# Patient Record
Sex: Female | Born: 1980
Health system: Southern US, Community
[De-identification: ages and names within clinical notes are randomized; demographics above are authoritative.]

## PROBLEM LIST (undated history)

## (undated) DIAGNOSIS — T7840XA Allergy, unspecified, initial encounter: Secondary | ICD-10-CM

## (undated) DIAGNOSIS — J45909 Unspecified asthma, uncomplicated: Secondary | ICD-10-CM

## (undated) DIAGNOSIS — D509 Iron deficiency anemia, unspecified: Secondary | ICD-10-CM

## (undated) HISTORY — DX: Unspecified asthma, uncomplicated: J45.909

## (undated) HISTORY — DX: Iron deficiency anemia, unspecified: D50.9

## (undated) HISTORY — PX: OTHER SURGICAL HISTORY: SHX169

## (undated) HISTORY — DX: Allergy, unspecified, initial encounter: T78.40XA

---

## 2003-06-04 ENCOUNTER — Emergency Department (HOSPITAL_COMMUNITY): Admission: EM | Admit: 2003-06-04 | Discharge: 2003-06-04 | Payer: Self-pay | Admitting: Emergency Medicine

## 2003-08-13 ENCOUNTER — Emergency Department (HOSPITAL_COMMUNITY): Admission: EM | Admit: 2003-08-13 | Discharge: 2003-08-14 | Payer: Self-pay | Admitting: Emergency Medicine

## 2003-08-23 ENCOUNTER — Emergency Department (HOSPITAL_COMMUNITY): Admission: EM | Admit: 2003-08-23 | Discharge: 2003-08-23 | Payer: Self-pay | Admitting: Family Medicine
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
## 2006-01-11 ENCOUNTER — Emergency Department (HOSPITAL_COMMUNITY): Admission: EM | Admit: 2006-01-11 | Discharge: 2006-01-11 | Payer: Self-pay | Admitting: Family Medicine

## 2007-04-27 ENCOUNTER — Emergency Department (HOSPITAL_COMMUNITY): Admission: AD | Admit: 2007-04-27 | Discharge: 2007-04-27 | Payer: Self-pay | Admitting: Emergency Medicine

## 2008-01-22 ENCOUNTER — Emergency Department (HOSPITAL_COMMUNITY): Admission: EM | Admit: 2008-01-22 | Discharge: 2008-01-22 | Payer: Self-pay | Admitting: Family Medicine

## 2010-11-09 LAB — POCT URINALYSIS DIP (DEVICE)
Bilirubin Urine: NEGATIVE
Nitrite: NEGATIVE
Operator id: 200941
Protein, ur: NEGATIVE
Specific Gravity, Urine: 1.01
Urobilinogen, UA: 0.2
pH: 6.5

## 2010-11-09 LAB — POCT PREGNANCY, URINE: Preg Test, Ur: NEGATIVE

## 2010-11-09 LAB — WET PREP, GENITAL

## 2010-11-20 LAB — POCT URINALYSIS DIP (DEVICE)
Glucose, UA: 100 mg/dL — AB
Nitrite: POSITIVE — AB
Protein, ur: 30 mg/dL — AB
Specific Gravity, Urine: <=1.005 (ref 1.005–1.030)
Urobilinogen, UA: 1 mg/dL (ref 0.0–1.0)
pH: 5 (ref 5.0–8.0)

## 2010-11-20 LAB — POCT PREGNANCY, URINE: Preg Test, Ur: NEGATIVE

## 2015-09-25 DIAGNOSIS — Z6826 Body mass index (BMI) 26.0-26.9, adult: Secondary | ICD-10-CM | POA: Diagnosis not present

## 2015-09-25 DIAGNOSIS — Z01419 Encounter for gynecological examination (general) (routine) without abnormal findings: Secondary | ICD-10-CM | POA: Diagnosis not present

## 2015-09-25 DIAGNOSIS — Z1151 Encounter for screening for human papillomavirus (HPV): Secondary | ICD-10-CM | POA: Diagnosis not present

## 2016-02-27 MED FILL — METHYLPREDNISOLONE 4 MG TAB: 4 | 6 days supply | Qty: 21 | Fill #0

## 2016-10-07 DIAGNOSIS — Z6825 Body mass index (BMI) 25.0-25.9, adult: Secondary | ICD-10-CM | POA: Diagnosis not present

## 2016-10-07 DIAGNOSIS — Z1151 Encounter for screening for human papillomavirus (HPV): Secondary | ICD-10-CM | POA: Diagnosis not present

## 2016-10-07 DIAGNOSIS — Z01419 Encounter for gynecological examination (general) (routine) without abnormal findings: Secondary | ICD-10-CM | POA: Diagnosis not present

## 2017-05-24 MED FILL — metroNIDAZOLE 500 MG TABS: 500 | 7 days supply | Qty: 14 | Fill #0

## 2017-06-13 MED FILL — AZITHROMYCIN 500 MG TABLET: 500 | 3 days supply | Qty: 3 | Fill #0

## 2017-10-31 MED FILL — metroNIDAZOLE 500 MG TABS: 500 | 7 days supply | Qty: 14 | Fill #0

## 2018-06-21 MED FILL — FLUCONAZOLE 150 MG TABS: 150 | 4 days supply | Qty: 2 | Fill #0

## 2020-08-28 ENCOUNTER — Ambulatory Visit: Payer: Self-pay

## 2021-04-28 LAB — HM PAP SMEAR: HPV, high-risk: NEGATIVE

## 2021-12-07 ENCOUNTER — Other Ambulatory Visit (HOSPITAL_BASED_OUTPATIENT_CLINIC_OR_DEPARTMENT_OTHER): Payer: Self-pay

## 2021-12-07 MED ORDER — COMIRNATY 30 MCG/0.3ML IM SUSY
PREFILLED_SYRINGE | INTRAMUSCULAR | 0 refills | Status: DC
  Filled 2021-12-07: qty 0.3, 1d supply, fill #0

## 2021-12-08 ENCOUNTER — Other Ambulatory Visit (HOSPITAL_COMMUNITY): Payer: Self-pay

## 2021-12-10 ENCOUNTER — Other Ambulatory Visit (HOSPITAL_COMMUNITY): Payer: Self-pay

## 2022-01-05 ENCOUNTER — Other Ambulatory Visit (HOSPITAL_BASED_OUTPATIENT_CLINIC_OR_DEPARTMENT_OTHER): Payer: Self-pay

## 2022-01-05 MED ORDER — FERROUS SULFATE 325 (65 FE) MG PO TBEC
DELAYED_RELEASE_TABLET | ORAL | 0 refills | Status: DC
  Filled 2022-01-05: qty 90, 90d supply, fill #0

## 2022-01-13 ENCOUNTER — Telehealth: Payer: Self-pay | Admitting: Physician Assistant

## 2022-01-13 NOTE — Telephone Encounter (Signed)
Spoke with patient confirming new patient appointments 12/12

## 2022-01-21 ENCOUNTER — Other Ambulatory Visit (HOSPITAL_BASED_OUTPATIENT_CLINIC_OR_DEPARTMENT_OTHER): Payer: Self-pay

## 2022-01-25 NOTE — Progress Notes (Unsigned)
[facility] [phone]   [phone]  INITIAL CONSULT NOTE  Patient Care Team: Default, Provider, MD as PCP - General  Hematological/Oncological History -12/31/2021: Labs from PCP, Dr. Fleet Contras Ferguson-WBC 5.0, Hgb 8.1 (L), MCV 70.0 (L), Plt 345, Ferritin 3.1 (L), TIBC 555 (H), iron 23 (L), saturation 4% (L), transferriin 297 (H). Hemoglobinopathy profile confirmed sickle cell trait (heterozygous).   -01/26/2022: Establish care with [facility]  CHIEF COMPLAINTS/PURPOSE OF CONSULTATION:  "Iron deficiency anemia "  HISTORY OF PRESENTING ILLNESS:  Amanda Ferguson 41 y.o. female ;presents to the hematology clinic for evaluation of iron deficiency anemia. She is unaccompanied for this visit.   On exam today, Ms. Amanda Ferguson reports feeling fatigued but can complete her ADLs on her own. She used to follow a vegan/vegetarian diet several years ago and now eats meat. She denies nausea, vomiting or abdominal pain. Her menstrual cycles are more irregular occurring every 21 days.Her cycles are lasting longer up to 8 days with 2 days of heavy bleeding. She is established with gynecologist, Dr. Cherly Ferguson, who she plans to follow up to discuss interventions to manage her menstrual bleeding. She does crave ice regularly. She denies fevers, chills, sweats, shortness of breath, chest pain or cough. She has no other complaints. Rest of the 10 point ROS is below.   MEDICAL HISTORY:  History reviewed. No pertinent past medical history.  SURGICAL HISTORY: History reviewed. No pertinent surgical history.  SOCIAL HISTORY: Social History   Socioeconomic History   Marital status: Legally Separated    Spouse name: Not on file   Number of children: Not on file   Years of education: Not on file   Highest education level: Not on file  Occupational History   Not on file  Tobacco Use   Smoking status: Not on file   Smokeless tobacco: Not on file  Substance and Sexual  Activity   Alcohol use: Not on file   Drug use: Not on file   Sexual activity: Not on file  Other Topics Concern   Not on file  Social History Narrative   Not on file   Social Determinants of Health   Financial Resource Strain: Not on file  Food Insecurity: Not on file  Transportation Needs: Not on file  Physical Activity: Not on file  Stress: Not on file  Social Connections: Not on file  Intimate Partner Violence: Not on file    FAMILY HISTORY: Family History  Problem Relation Age of Onset   Breast cancer Mother    Breast cancer Maternal Aunt     ALLERGIES:  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  MEDICATIONS:  Current Outpatient Medications  Medication Sig Dispense Refill   COVID-19 mRNA vaccine 2023-2024 (COMIRNATY) syringe Inject into the muscle. 0.3 mL 0   ferrous sulfate 325 (65 FE) MG EC tablet 1 tablet Orally once a day 90 days (Patient not taking: Reported on 01/26/2022) 90 tablet 0   No current facility-administered medications for this visit.    REVIEW OF SYSTEMS:   Constitutional: ( - ) fevers, ( - )  chills , ( - ) night sweats Eyes: ( - ) blurriness of vision, ( - ) double vision, ( - ) watery eyes Ears, nose, mouth, throat, and face: ( - ) mucositis, ( - ) sore throat Respiratory: ( - ) cough, ( - ) dyspnea, ( - ) wheezes Cardiovascular: ( - ) palpitation, ( - ) chest discomfort, ( - ) lower extremity swelling  Gastrointestinal:  ( - ) nausea, ( - ) heartburn, ( - ) change in bowel habits Skin: ( - ) abnormal skin rashes Lymphatics: ( - ) new lymphadenopathy, ( - ) easy bruising Neurological: ( - ) numbness, ( - ) tingling, ( - ) new weaknesses Behavioral/Psych: ( - ) mood change, ( - ) new changes  All other systems were reviewed with the patient and are negative.  PHYSICAL EXAMINATION: ECOG PERFORMANCE STATUS: 1 - Symptomatic but completely ambulatory  Vitals:   01/26/22 0947  BP: (!) 99/59  Pulse: 73  Resp: 15  Temp: 97.7 F (36.5 C)   SpO2: 100%   Filed Weights   01/26/22 0947  Weight: 168 lb 4.8 oz (76.3 kg)    GENERAL: well appearing female in NAD  SKIN: skin color, texture, turgor are normal, no rashes or significant lesions EYES: conjunctiva are pink and non-injected, sclera clear OROPHARYNX: no exudate, no erythema; lips, buccal mucosa, and tongue normal  NECK: supple, non-tender LYMPH:  no palpable lymphadenopathy in the cervical or supraclavicular lymph nodes.  LUNGS: clear to auscultation and percussion with normal breathing effort HEART: regular rate & rhythm and no murmurs and no lower extremity edema Musculoskeletal: no cyanosis of digits and no clubbing  PSYCH: alert & oriented x 3, fluent speech NEURO: no focal motor/sensory deficits  LABORATORY DATA:  I have reviewed the data as listed    Latest Ref Rng & Units 01/26/2022   10:15 AM  CBC  WBC 4.0 - 10.5 K/uL 5.7   Hemoglobin 12.0 - 15.0 g/dL 7.8   Hematocrit 36.0 - 46.0 % 25.2   Platelets 150 - 400 K/uL 362        Latest Ref Rng & Units 01/26/2022   10:15 AM  CMP  Glucose 70 - 99 mg/dL 78   BUN 6 - 20 mg/dL 10   Creatinine 0.44 - 1.00 mg/dL 0.87   Sodium 135 - 145 mmol/L 137   Potassium 3.5 - 5.1 mmol/L 4.2   Chloride 98 - 111 mmol/L 107   CO2 22 - 32 mmol/L 26   Calcium 8.9 - 10.3 mg/dL 9.4   Total Protein 6.5 - 8.1 g/dL 6.8   Total Bilirubin 0.3 - 1.2 mg/dL 0.3   Alkaline Phos 38 - 126 U/L 54   AST 15 - 41 U/L 21   ALT 0 - 44 U/L 10      ASSESSMENT & PLAN Amanda Ferguson is a 41 y.o. female who presents to the hematology clinic for evaluation of iron deficiency anemia.   # Iron Deficiency Anemia 2/2 to GYN Bleeding -- Findings are consistent with iron deficiency anemia secondary to patient's menorrhagia --Encouraged her to follow-up with OB/GYN for better control of her menstrual cycles --We will confirm iron deficiency anemia by ordering iron panel and ferritin as well as reticulocytes, CBC, and CMP --Unable to  tolerate PO iron.  --We will plan to proceed with IV iron therapy in order to help bolster the patient's blood counts --Plan for return to clinic in 4 to 6 weeks time after last dose of IV iron  #Sickle Cell Trait: --Will contribute to patient's microcytosis.  --Monitor for now.   Orders Placed This Encounter  Procedures   CBC with Differential ([place] Only)    Standing Status:   Future    Number of Occurrences:   1    Standing Expiration Date:   01/26/2023   CMP ([place] only)    Standing  Status:   Future    Number of Occurrences:   1    Standing Expiration Date:   01/26/2023   Ferritin    Standing Status:   Future    Number of Occurrences:   1    Standing Expiration Date:   01/26/2023   Iron and Iron Binding Capacity ([facility] only)    Standing Status:   Future    Number of Occurrences:   1    Standing Expiration Date:   01/26/2023   Retic Panel    Standing Status:   Future    Number of Occurrences:   1    Standing Expiration Date:   01/26/2023   Sample to Blood Bank    Standing Status:   Future    Number of Occurrences:   1    Standing Expiration Date:   01/27/2023    All questions were answered. The patient knows to call the clinic with any problems, questions or concerns.  I have spent a total of 60 minutes minutes of face-to-face and non-face-to-face time, preparing to see the patient, obtaining and/or reviewing separately obtained history, performing a medically appropriate examination, counseling and educating the patient, ordering medications/tests/procedures, documenting clinical information in the electronic health record,and care coordination.   Dede Query, PA-C Department of Hematology/Oncology [place] at [facility] Phone: [phone]

## 2022-01-26 ENCOUNTER — Inpatient Hospital Stay: Payer: Managed Care, Other (non HMO)

## 2022-01-26 ENCOUNTER — Encounter: Payer: Self-pay | Admitting: Physician Assistant

## 2022-01-26 ENCOUNTER — Inpatient Hospital Stay: Payer: Managed Care, Other (non HMO) | Attending: Physician Assistant | Admitting: Physician Assistant

## 2022-01-26 VITALS — BP 99/59 | HR 73 | Temp 97.7°F | Resp 15 | Wt 168.3 lb

## 2022-01-26 DIAGNOSIS — D573 Sickle-cell trait: Secondary | ICD-10-CM | POA: Diagnosis not present

## 2022-01-26 DIAGNOSIS — N921 Excessive and frequent menstruation with irregular cycle: Secondary | ICD-10-CM | POA: Diagnosis not present

## 2022-01-26 DIAGNOSIS — D500 Iron deficiency anemia secondary to blood loss (chronic): Secondary | ICD-10-CM | POA: Insufficient documentation

## 2022-01-26 LAB — CMP (CANCER CENTER ONLY)
ALT: 10 U/L (ref 0–44)
AST: 21 U/L (ref 15–41)
Albumin: 4.3 g/dL (ref 3.5–5.0)
Alkaline Phosphatase: 54 U/L (ref 38–126)
Anion gap: 4 — ABNORMAL LOW (ref 5–15)
BUN: 10 mg/dL (ref 6–20)
CO2: 26 mmol/L (ref 22–32)
Calcium: 9.4 mg/dL (ref 8.9–10.3)
Chloride: 107 mmol/L (ref 98–111)
Creatinine: 0.87 mg/dL (ref 0.44–1.00)
GFR, Estimated: >60 mL/min (ref >60)
Glucose, Bld: 78 mg/dL (ref 70–99)
Potassium: 4.2 mmol/L (ref 3.5–5.1)
Sodium: 137 mmol/L (ref 135–145)
Total Bilirubin: 0.3 mg/dL (ref 0.3–1.2)
Total Protein: 6.8 g/dL (ref 6.5–8.1)

## 2022-01-26 LAB — CBC WITH DIFFERENTIAL (CANCER CENTER ONLY)
Abs Immature Granulocytes: 0.01 10*3/uL (ref 0.00–0.07)
Basophils Absolute: 0 10*3/uL (ref 0.0–0.1)
Basophils Relative: 1 %
Eosinophils Absolute: 0.2 10*3/uL (ref 0.0–0.5)
Eosinophils Relative: 4 %
HCT: 25.2 % — ABNORMAL LOW (ref 36.0–46.0)
Hemoglobin: 7.8 g/dL — ABNORMAL LOW (ref 12.0–15.0)
Immature Granulocytes: 0 %
Lymphocytes Relative: 29 %
Lymphs Abs: 1.7 10*3/uL (ref 0.7–4.0)
MCH: 22.1 pg — ABNORMAL LOW (ref 26.0–34.0)
MCHC: 31 g/dL (ref 30.0–36.0)
MCV: 71.4 fL — ABNORMAL LOW (ref 80.0–100.0)
Monocytes Absolute: 0.3 10*3/uL (ref 0.1–1.0)
Monocytes Relative: 5 %
Neutro Abs: 3.5 10*3/uL (ref 1.7–7.7)
Neutrophils Relative %: 61 %
Platelet Count: 362 10*3/uL (ref 150–400)
RBC: 3.53 MIL/uL — ABNORMAL LOW (ref 3.87–5.11)
RDW: 20.2 % — ABNORMAL HIGH (ref 11.5–15.5)
WBC Count: 5.7 10*3/uL (ref 4.0–10.5)
nRBC: 0 % (ref 0.0–0.2)

## 2022-01-26 LAB — RETIC PANEL
Immature Retic Fract: 17.1 % — ABNORMAL HIGH (ref 2.3–15.9)
RBC.: 3.56 MIL/uL — ABNORMAL LOW (ref 3.87–5.11)
Retic Count, Absolute: 34.5 10*3/uL (ref 19.0–186.0)
Retic Ct Pct: 1 % (ref 0.4–3.1)
Reticulocyte Hemoglobin: 22.8 pg — ABNORMAL LOW (ref >27.9)

## 2022-01-26 LAB — FERRITIN: Ferritin: 4 ng/mL — ABNORMAL LOW (ref 11–307)

## 2022-01-26 LAB — IRON AND IRON BINDING CAPACITY (CC-WL,HP ONLY)
Iron: 5 ug/dL — ABNORMAL LOW (ref 28–170)
Saturation Ratios: 1 % — ABNORMAL LOW (ref 10.4–31.8)
TIBC: 473 ug/dL — ABNORMAL HIGH (ref 250–450)
UIBC: 468 ug/dL — ABNORMAL HIGH (ref 148–442)

## 2022-01-26 LAB — SAMPLE TO BLOOD BANK

## 2022-01-27 ENCOUNTER — Encounter: Payer: Self-pay | Admitting: Physician Assistant

## 2022-01-28 ENCOUNTER — Encounter: Payer: Self-pay | Admitting: Physician Assistant

## 2022-01-28 ENCOUNTER — Inpatient Hospital Stay: Payer: Managed Care, Other (non HMO)

## 2022-01-28 VITALS — BP 104/60 | HR 56 | Temp 98.4°F | Resp 18

## 2022-01-28 DIAGNOSIS — D500 Iron deficiency anemia secondary to blood loss (chronic): Secondary | ICD-10-CM | POA: Diagnosis not present

## 2022-01-28 MED ORDER — SODIUM CHLORIDE 0.9 % IV SOLN: Freq: Once | INTRAVENOUS | Status: AC

## 2022-01-28 MED ORDER — SODIUM CHLORIDE 0.9 % IV SOLN
200.0000 mg | Freq: Once | INTRAVENOUS | Status: AC
  Administered 2022-01-28: 200 mg via INTRAVENOUS
  Filled 2022-01-28: qty 200

## 2022-01-28 NOTE — Patient Instructions (Signed)

## 2022-02-04 ENCOUNTER — Other Ambulatory Visit: Payer: Self-pay

## 2022-02-04 ENCOUNTER — Inpatient Hospital Stay: Payer: Managed Care, Other (non HMO)

## 2022-02-04 VITALS — BP 103/43 | HR 65 | Temp 98.6°F | Resp 17

## 2022-02-04 DIAGNOSIS — D500 Iron deficiency anemia secondary to blood loss (chronic): Secondary | ICD-10-CM

## 2022-02-04 MED ORDER — SODIUM CHLORIDE 0.9 % IV SOLN
200.0000 mg | Freq: Once | INTRAVENOUS | Status: AC
  Administered 2022-02-04: 200 mg via INTRAVENOUS
  Filled 2022-02-04: qty 200

## 2022-02-04 MED ORDER — SODIUM CHLORIDE 0.9 % IV SOLN: Freq: Once | INTRAVENOUS | Status: AC

## 2022-02-11 ENCOUNTER — Inpatient Hospital Stay: Payer: Managed Care, Other (non HMO)

## 2022-02-11 VITALS — BP 102/48 | HR 50 | Temp 98.1°F | Resp 17

## 2022-02-11 DIAGNOSIS — D500 Iron deficiency anemia secondary to blood loss (chronic): Secondary | ICD-10-CM

## 2022-02-11 MED ORDER — SODIUM CHLORIDE 0.9 % IV SOLN
200.0000 mg | Freq: Once | INTRAVENOUS | Status: AC
  Administered 2022-02-11: 200 mg via INTRAVENOUS
  Filled 2022-02-11: qty 200

## 2022-02-11 MED ORDER — SODIUM CHLORIDE 0.9 % IV SOLN: Freq: Once | INTRAVENOUS | Status: AC

## 2022-02-18 ENCOUNTER — Inpatient Hospital Stay: Payer: Self-pay | Attending: Physician Assistant

## 2022-02-18 VITALS — BP 99/58 | HR 55 | Temp 98.1°F | Resp 18

## 2022-02-18 DIAGNOSIS — N920 Excessive and frequent menstruation with regular cycle: Secondary | ICD-10-CM | POA: Insufficient documentation

## 2022-02-18 DIAGNOSIS — D500 Iron deficiency anemia secondary to blood loss (chronic): Secondary | ICD-10-CM | POA: Insufficient documentation

## 2022-02-18 MED ORDER — SODIUM CHLORIDE 0.9 % IV SOLN
200.0000 mg | Freq: Once | INTRAVENOUS | Status: AC
  Administered 2022-02-18: 200 mg via INTRAVENOUS
  Filled 2022-02-18: qty 200

## 2022-02-18 MED ORDER — SODIUM CHLORIDE 0.9 % IV SOLN: Freq: Once | INTRAVENOUS | Status: AC

## 2022-02-18 NOTE — Patient Instructions (Signed)

## 2022-02-21 ENCOUNTER — Encounter: Payer: Self-pay | Admitting: Physician Assistant

## 2022-02-25 ENCOUNTER — Inpatient Hospital Stay: Payer: Self-pay

## 2022-02-25 VITALS — BP 104/57 | HR 64 | Temp 97.9°F | Resp 18

## 2022-02-25 DIAGNOSIS — D500 Iron deficiency anemia secondary to blood loss (chronic): Secondary | ICD-10-CM

## 2022-02-25 MED ORDER — SODIUM CHLORIDE 0.9 % IV SOLN
200.0000 mg | Freq: Once | INTRAVENOUS | Status: AC
  Administered 2022-02-25: 200 mg via INTRAVENOUS
  Filled 2022-02-25: qty 200

## 2022-02-25 MED ORDER — SODIUM CHLORIDE 0.9 % IV SOLN: Freq: Once | INTRAVENOUS | Status: AC

## 2022-02-25 NOTE — Patient Instructions (Signed)

## 2022-03-02 ENCOUNTER — Other Ambulatory Visit (HOSPITAL_BASED_OUTPATIENT_CLINIC_OR_DEPARTMENT_OTHER): Payer: Self-pay

## 2022-03-02 MED ORDER — METRONIDAZOLE 500 MG PO TABS
500.0000 mg | ORAL_TABLET | Freq: Two times a day (BID) | ORAL | 0 refills | Status: DC
  Filled 2022-03-02 – 2022-03-18 (×2): qty 14, 7d supply, fill #0

## 2022-03-15 ENCOUNTER — Other Ambulatory Visit (HOSPITAL_BASED_OUTPATIENT_CLINIC_OR_DEPARTMENT_OTHER): Payer: Self-pay

## 2022-03-18 ENCOUNTER — Other Ambulatory Visit (HOSPITAL_BASED_OUTPATIENT_CLINIC_OR_DEPARTMENT_OTHER): Payer: Self-pay

## 2022-03-19 ENCOUNTER — Inpatient Hospital Stay: Payer: Self-pay | Admitting: Physician Assistant

## 2022-03-19 ENCOUNTER — Telehealth: Payer: Self-pay | Admitting: Physician Assistant

## 2022-03-19 ENCOUNTER — Inpatient Hospital Stay: Payer: Self-pay

## 2022-03-19 ENCOUNTER — Other Ambulatory Visit: Payer: Self-pay

## 2022-03-19 ENCOUNTER — Other Ambulatory Visit (HOSPITAL_BASED_OUTPATIENT_CLINIC_OR_DEPARTMENT_OTHER): Payer: Self-pay

## 2022-03-19 DIAGNOSIS — U071 COVID-19: Secondary | ICD-10-CM

## 2022-03-19 MED ORDER — NIRMATRELVIR/RITONAVIR (PAXLOVID)TABLET
3.0000 | ORAL_TABLET | Freq: Two times a day (BID) | ORAL | 0 refills | Status: AC
  Filled 2022-03-19: qty 30, 5d supply, fill #0

## 2022-03-19 NOTE — Patient Instructions (Signed)
Amanda Ferguson, thank you for joining Mar Daring, PA-C for today's virtual visit.  While this provider is not your primary care provider (PCP), if your PCP is located in our provider database this encounter information will be shared with them immediately following your visit.   [facility] account gives you access to today's visit and all your visits, tests, and labs performed at [facility] " click here if you don't have a [facility] account or go to [email or web address]  Consent: (Patient) Amanda Ferguson provided verbal consent for this virtual visit at the beginning of the encounter.  Current Medications:  Current Outpatient Medications:    nirmatrelvir/ritonavir (PAXLOVID) 20 x 150 MG & 10 x 100MG  TABS, Take 3 tablets by mouth 2 (two) times daily for 5 days., Disp: 30 tablet, Rfl: 0   COVID-19 mRNA vaccine 2023-2024 (COMIRNATY) syringe, Inject into the muscle., Disp: 0.3 mL, Rfl: 0   metroNIDAZOLE (FLAGYL) 500 MG tablet, Take 1 tablet (500 mg total) by mouth 2 (two) times daily., Disp: 14 tablet, Rfl: 0   Medications ordered in this encounter:  Meds ordered this encounter  Medications   nirmatrelvir/ritonavir (PAXLOVID) 20 x 150 MG & 10 x 100MG  TABS    Sig: Take 3 tablets by mouth 2 (two) times daily for 5 days.    Dispense:  30 tablet    Refill:  0    Order Specific Question:   Supervising Provider    Answer:   Chase Picket [id number]     *If you need refills on other medications prior to your next appointment, please contact your pharmacy*  Follow-Up: Call back or seek an in-person evaluation if the symptoms worsen or if the condition fails to improve as anticipated.  [place] [phone]  Care Instructions:  Paxlovid- Nirmatrelvir; Ritonavir Tablets What is this medication? NIRMATRELVIR; RITONAVIR (NIR ma TREL vir; ri TOE na veer) treats mild to moderate COVID-19. It may help people who are at  high risk of developing severe illness. It works by limiting the spread of the virus in your body. This medicine may be used for other purposes; ask your health care provider or pharmacist if you have questions. COMMON BRAND NAME(S): PAXLOVID What should I tell my care team before I take this medication? They need to know if you have any of these conditions: Any allergies Any serious illness Kidney disease Liver disease An unusual or allergic reaction to nirmatrelvir, ritonavir, other medications, foods, dyes, or preservatives Pregnant or trying to get pregnant Breast-feeding How should I use this medication? This product contains 2 different medications that are packaged together. For the standard dose, take 2 pink tablets of nirmatrelvir with 1 white tablet of ritonavir (3 tablets total) by mouth with water twice daily. Talk to your care team if you have kidney disease. You may need a different dose. Swallow the tablets whole. You can take it with or without food. If it upsets your stomach, take it with food. Take all of this medication unless your care team tells you to stop it early. Keep taking it even if you think you are better. Talk to your care team about the use of this medication in children. While it may be prescribed for children as young as 12 years for selected conditions, precautions do apply. Overdosage: If you think you have taken too much of this medicine contact a poison control center or emergency room at once. NOTE: This medicine is only  for you. Do not share this medicine with others. What if I miss a dose? If you miss a dose, take it as soon as you can unless it is more than 8 hours late. If it is more than 8 hours late, skip the missed dose. Take the next dose at the normal time. Do not take extra or 2 doses at the same time to make up for the missed dose. What may interact with this medication? Do not take this medication with any of the following  medications: Alfuzosin Certain medications for anxiety or sleep like midazolam, triazolam Certain medications for cancer like apalutamide, enzalutamide Certain medications for cholesterol like lovastatin, simvastatin Certain medications for irregular heart beat like amiodarone, dronedarone, flecainide, propafenone, quinidine Certain medications for pain like meperidine, piroxicam Certain medications for psychotic disorders like clozapine, lurasidone, pimozide Certain medications for seizures like carbamazepine, phenobarbital, phenytoin Colchicine Eletriptan Eplerenone Ergot alkaloids like dihydroergotamine, ergonovine, ergotamine, methylergonovine Finerenone Flibanserin Ivabradine Lomitapide Naloxegol Ranolazine Rifampin Sildenafil Silodosin St. John's Wort Tolvaptan Ubrogepant Voclosporin This medication may also interact with the following medications: Bedaquiline Birth control pills Bosentan Certain antibiotics like erythromycin or clarithromycin Certain medications for blood pressure like amlodipine, diltiazem, felodipine, nicardipine, nifedipine Certain medications for cancer like abemaciclib, ceritinib, dasatinib, encorafenib, ibrutinib, ivosidenib, neratinib, nilotinib, venetoclax, vinblastine, vincristine Certain medications for cholesterol like atorvastatin, rosuvastatin Certain medications for depression like bupropion, trazodone Certain medications for fungal infections like isavuconazonium, itraconazole, ketoconazole, voriconazole Certain medications for hepatitis C like elbasvir; grazoprevir, dasabuvir; ombitasvir; paritaprevir; ritonavir, glecaprevir; pibrentasvir, sofosbuvir; velpatasvir; voxilaprevir Certain medications for HIV or AIDS Certain medications for irregular heartbeat like lidocaine Certain medications that treat or prevent blood clots like rivaroxaban, warfarin Digoxin Fentanyl Medications that lower your chance of fighting infection like  cyclosporine, sirolimus, tacrolimus Methadone Quetiapine Rifabutin Salmeterol Steroid medications like betamethasone, budesonide, ciclesonide, dexamethasone, fluticasone, methylprednisolone, mometasone, triamcinolone This list may not describe all possible interactions. Give your health care provider a list of all the medicines, herbs, non-prescription drugs, or dietary supplements you use. Also tell them if you smoke, drink alcohol, or use illegal drugs. Some items may interact with your medicine. What should I watch for while using this medication? Your condition will be monitored carefully while you are receiving this medication. Visit your care team for regular checkups. Tell your care team if your symptoms do not start to get better or if they get worse. If you have untreated HIV infection, this medication may lead to some HIV medications not working as well in the future. Estrogen and progestin hormones may not work as well while you are taking this medication. Your care team can help you find the contraceptive option that works for you. What side effects may I notice from receiving this medication? Side effects that you should report to your care team as soon as possible: Allergic reactions--skin rash, itching, hives, swelling of the face, lips, tongue, or throat Liver injury--right upper belly pain, loss of appetite, nausea, light-colored stool, dark yellow or brown urine, yellowing skin or eyes, unusual weakness or fatigue Redness, blistering, peeling, or loosening of the skin, including inside the mouth Side effects that usually do not require medical attention (report these to your care team if they continue or are bothersome): Change in taste Diarrhea General discomfort and fatigue Increase in blood pressure Muscle pain Nausea Stomach pain This list may not describe all possible side effects. Call your doctor for medical advice about side effects. You may report side effects to  FDA at 1-800-FDA-1088. Where  should I keep my medication? Keep out of the reach of children and pets. Store at room temperature between 20 and 25 degrees C (68 and 77 degrees F). Get rid of any unused medication after the expiration date. To get rid of medications that are no longer needed or have expired: Take the medication to a medication take-back program. Check with your pharmacy or law enforcement to find a location. If you cannot return the medication, check the label or package insert to see if the medication should be thrown out in the garbage or flushed down the toilet. If you are not sure, ask your care team. If it is safe to put it in the trash, take the medication out of the container. Mix the medication with cat litter, dirt, coffee grounds, or other unwanted substance. Seal the mixture in a bag or container. Put it in the trash. NOTE: This sheet is a summary. It may not cover all possible information. If you have questions about this medicine, talk to your doctor, pharmacist, or health care provider.  2023 Elsevier/Gold Standard (2020-02-11 00:00:00)    Isolation Instructions: You are to isolate at home for 5 days from onset of your symptoms. If you must be around other household members who do not have symptoms, you need to make sure that both you and the family members are masking consistently with a high-quality mask.  After day 5 of isolation, if you have had no fever within 24 hours and you are feeling better, you can end isolation but need to mask for an additional 5 days.  After day 5 if you have a fever or are having significant symptoms, please isolate for full 10 days.  If you note any worsening of symptoms despite treatment, please seek an in-person evaluation ASAP. If you note any significant shortness of breath or any chest pain, please seek ER evaluation. Please do not delay care!   COVID-19: What to Do if You Are Sick If you test positive and are an older adult or  someone who is at high risk of getting very sick from COVID-19, treatment may be available. Contact a healthcare provider right away after a positive test to determine if you are eligible, even if your symptoms are mild right now. You can also visit a Test to Treat location and, if eligible, receive a prescription from a provider. Don't delay: Treatment must be started within the first few days to be effective. If you have a fever, cough, or other symptoms, you might have COVID-19. Most people have mild illness and are able to recover at home. If you are sick: Keep track of your symptoms. If you have an emergency warning sign (including trouble breathing), call 911. Steps to help prevent the spread of COVID-19 if you are sick If you are sick with COVID-19 or think you might have COVID-19, follow the steps below to care for yourself and to help protect other people in your home and community. Stay home except to get medical care Stay home. Most people with COVID-19 have mild illness and can recover at home without medical care. Do not leave your home, except to get medical care. Do not visit public areas and do not go to places where you are unable to wear a mask. Take care of yourself. Get rest and stay hydrated. Take over-the-counter medicines, such as acetaminophen, to help you feel better. Stay in touch with your doctor. Call before you get medical care. Be sure to get care  if you have trouble breathing, or have any other emergency warning signs, or if you think it is an emergency. Avoid public transportation, ride-sharing, or taxis if possible. Get tested If you have symptoms of COVID-19, get tested. While waiting for test results, stay away from others, including staying apart from those living in your household. Get tested as soon as possible after your symptoms start. Treatments may be available for people with COVID-19 who are at risk for becoming very sick. Don't delay: Treatment must be  started early to be effective--some treatments must begin within 5 days of your first symptoms. Contact your healthcare provider right away if your test result is positive to determine if you are eligible. Self-tests are one of several options for testing for the virus that causes COVID-19 and may be more convenient than laboratory-based tests and point-of-care tests. Ask your healthcare provider or your local health department if you need help interpreting your test results. You can visit your state, tribal, local, and territorial health department's website to look for the latest local information on testing sites. Separate yourself from other people As much as possible, stay in a specific room and away from other people and pets in your home. If possible, you should use a separate bathroom. If you need to be around other people or animals in or outside of the home, wear a well-fitting mask. Tell your close contacts that they may have been exposed to COVID-19. An infected person can spread COVID-19 starting 48 hours (or 2 days) before the person has any symptoms or tests positive. By letting your close contacts know they may have been exposed to COVID-19, you are helping to protect everyone. See COVID-19 and Animals if you have questions about pets. If you are diagnosed with COVID-19, someone from the health department may call you. Answer the call to slow the spread. Monitor your symptoms Symptoms of COVID-19 include fever, cough, or other symptoms. Follow care instructions from your healthcare provider and local health department. Your local health authorities may give instructions on checking your symptoms and reporting information. When to seek emergency medical attention Look for emergency warning signs* for COVID-19. If someone is showing any of these signs, seek emergency medical care immediately: Trouble breathing Persistent pain or pressure in the chest New confusion Inability to wake or  stay awake Pale, gray, or blue-colored skin, lips, or nail beds, depending on skin tone *This list is not all possible symptoms. Please call your medical provider for any other symptoms that are severe or concerning to you. Call 911 or call ahead to your local emergency facility: Notify the operator that you are seeking care for someone who has or may have COVID-19. Call ahead before visiting your doctor Call ahead. Many medical visits for routine care are being postponed or done by phone or telemedicine. If you have a medical appointment that cannot be postponed, call your doctor's office, and tell them you have or may have COVID-19. This will help the office protect themselves and other patients. If you are sick, wear a well-fitting mask You should wear a mask if you must be around other people or animals, including pets (even at home). Wear a mask with the best fit, protection, and comfort for you. You don't need to wear the mask if you are alone. If you can't put on a mask (because of trouble breathing, for example), cover your coughs and sneezes in some other way. Try to stay at least 6 feet away from  other people. This will help protect the people around you. Masks should not be placed on young children under age 49 years, anyone who has trouble breathing, or anyone who is not able to remove the mask without help. Cover your coughs and sneezes Cover your mouth and nose with a tissue when you cough or sneeze. Throw away used tissues in a lined trash can. Immediately wash your hands with soap and water for at least 20 seconds. If soap and water are not available, clean your hands with an alcohol-based hand sanitizer that contains at least 60% alcohol. Clean your hands often Wash your hands often with soap and water for at least 20 seconds. This is especially important after blowing your nose, coughing, or sneezing; going to the bathroom; and before eating or preparing food. Use hand sanitizer if  soap and water are not available. Use an alcohol-based hand sanitizer with at least 60% alcohol, covering all surfaces of your hands and rubbing them together until they feel dry. Soap and water are the best option, especially if hands are visibly dirty. Avoid touching your eyes, nose, and mouth with unwashed hands. Handwashing Tips Avoid sharing personal household items Do not share dishes, drinking glasses, cups, eating utensils, towels, or bedding with other people in your home. Wash these items thoroughly after using them with soap and water or put in the dishwasher. Clean surfaces in your home regularly Clean and disinfect high-touch surfaces (for example, doorknobs, tables, handles, light switches, and countertops) in your "sick room" and bathroom. In shared spaces, you should clean and disinfect surfaces and items after each use by the person who is ill. If you are sick and cannot clean, a caregiver or other person should only clean and disinfect the area around you (such as your bedroom and bathroom) on an as needed basis. Your caregiver/other person should wait as long as possible (at least several hours) and wear a mask before entering, cleaning, and disinfecting shared spaces that you use. Clean and disinfect areas that may have blood, stool, or body fluids on them. Use household cleaners and disinfectants. Clean visible dirty surfaces with household cleaners containing soap or detergent. Then, use a household disinfectant. Use a product from [employer] List N: Disinfectants for Coronavirus (COVID-19). Be sure to follow the instructions on the label to ensure safe and effective use of the product. Many products recommend keeping the surface wet with a disinfectant for a certain period of time (look at "contact time" on the product label). You may also need to wear personal protective equipment, such as gloves, depending on the directions on the product label. Immediately after disinfecting, wash  your hands with soap and water for 20 seconds. For completed guidance on cleaning and disinfecting your home, visit Complete Disinfection Guidance. Take steps to improve ventilation at home Improve ventilation (air flow) at home to help prevent from spreading COVID-19 to other people in your household. Clear out COVID-19 virus particles in the air by opening windows, using air filters, and turning on fans in your home. Use this interactive tool to learn how to improve air flow in your home. When you can be around others after being sick with COVID-19 Deciding when you can be around others is different for different situations. Find out when you can safely end home isolation. For any additional questions about your care, contact your healthcare provider or state or local health department. 05/06/2020 Content source: [facility] for Immunization and Respiratory Diseases ([facility]), Division of Viral  Diseases This information is not intended to replace advice given to you by your health care provider. Make sure you discuss any questions you have with your health care provider. Document Revised: 06/19/2020 Document Reviewed: 06/19/2020 Elsevier Patient Education  2022 [employer].   If you have been instructed to have an in-person evaluation today at a local Urgent Care facility, please use the link below. It will take you to a list of all of our available [facility], including address, phone number and hours of operation. Please do not delay care.  [facility]  If you or a family member do not have a primary care provider, use the link below to schedule a visit and establish care. When you choose a [facility] primary care physician or advanced practice provider, you gain a long-term partner in health. Find a Primary Care Provider  Learn more about [facility]'s in-office and virtual care options: [facility]

## 2022-03-19 NOTE — Progress Notes (Signed)
Virtual Visit Consent   [name], you are scheduled for a virtual visit with a [facility] provider today. Just as with appointments in the office, your consent must be obtained to participate. Your consent will be active for this visit and any virtual visit you may have with one of our providers in the next 365 days. If you have a MyChart account, a copy of this consent can be sent to you electronically.  As this is a virtual visit, video technology does not allow for your provider to perform a traditional examination. This may limit your provider's ability to fully assess your condition. If your provider identifies any concerns that need to be evaluated in person or the need to arrange testing (such as labs, EKG, etc.), we will make arrangements to do so. Although advances in technology are sophisticated, we cannot ensure that it will always work on either your end or our end. If the connection with a video visit is poor, the visit may have to be switched to a telephone visit. With either a video or telephone visit, we are not always able to ensure that we have a secure connection.  By engaging in this virtual visit, you consent to the provision of healthcare and authorize for your insurance to be billed (if applicable) for the services provided during this visit. Depending on your insurance coverage, you may receive a charge related to this service.  I need to obtain your verbal consent now. Are you willing to proceed with your visit today? Amanda Ferguson has provided verbal consent on 03/19/2022 for a virtual visit (video or telephone). Mar Daring, PA-C  Date: 03/19/2022 4:03 PM  Virtual Visit via Video Note   I, Mar Daring, connected with  Amanda Ferguson  ([id number], 41-17-1982) on 03/19/22 at  4:00 PM EST by a video-enabled telemedicine application and verified that I am speaking with the correct person using two identifiers.  Location: Patient: Virtual Visit  Location Patient: Home Provider: Virtual Visit Location Provider: Home Office   I discussed the limitations of evaluation and management by telemedicine and the availability of in person appointments. The patient expressed understanding and agreed to proceed.    History of Present Illness: Amanda Ferguson is a 42 y.o. who identifies as a female who was assigned female at birth, and is being seen today for Covid 63.  HPI: URI  This is a new problem. Episode onset: tested positive today; Symptoms started yesterday. The problem has been gradually worsening. Maximum temperature: hot and cold flashes; sweats and chills. Associated symptoms include congestion, coughing, ear pain (smallest amount in right ear), headaches, a plugged ear sensation, rhinorrhea (and post nasal drainage) and sinus pain. Pertinent negatives include no diarrhea, nausea, sore throat or vomiting. Associated symptoms comments: Fatigue, myalgias, chills. Treatments tried: netti pot, theraflu. The treatment provided no relief.     Problems:  Patient Active Problem List   Diagnosis Date Noted   Iron deficiency anemia due to chronic blood loss 01/26/2022   Sickle cell trait ([place]) 01/26/2022    Allergies:  Allergies  Allergen Reactions   Penicillins Nausea And Vomiting and Other (See Comments)   Soy Allergy Diarrhea, Nausea Only and Swelling   Medications:  Current Outpatient Medications:    nirmatrelvir/ritonavir (PAXLOVID) 20 x 150 MG & 10 x 100MG  TABS, Take 3 tablets by mouth 2 (two) times daily for 5 days., Disp: 30 tablet, Rfl: 0   COVID-19 mRNA vaccine 2023-2024 (COMIRNATY) syringe,  Inject into the muscle., Disp: 0.3 mL, Rfl: 0   metroNIDAZOLE (FLAGYL) 500 MG tablet, Take 1 tablet (500 mg total) by mouth 2 (two) times daily., Disp: 14 tablet, Rfl: 0  Observations/Objective: Patient is well-developed, well-nourished in no acute distress.  Resting comfortably  at home.  Head is normocephalic, atraumatic.  No  labored breathing.  Speech is clear and coherent with logical content.  Patient is alert and oriented at baseline.    Assessment and Plan: 1. COVID-19 - nirmatrelvir/ritonavir (PAXLOVID) 20 x 150 MG & 10 x 100MG  TABS; Take 3 tablets by mouth 2 (two) times daily for 5 days.  Dispense: 30 tablet; Refill: 0  - Continue OTC symptomatic management of choice - Will send OTC vitamins and supplement information through AVS - Paxlovid prescribed - Patient enrolled in MyChart symptom monitoring - Push fluids - Rest as needed - Discussed return precautions and when to seek in-person evaluation, sent via AVS as well   Follow Up Instructions: I discussed the assessment and treatment plan with the patient. The patient was provided an opportunity to ask questions and all were answered. The patient agreed with the plan and demonstrated an understanding of the instructions.  A copy of instructions were sent to the patient via MyChart unless otherwise noted below.    The patient was advised to call back or seek an in-person evaluation if the symptoms worsen or if the condition fails to improve as anticipated.  Time:  I spent 12 minutes with the patient via telehealth technology discussing the above problems/concerns.    Mar Daring, PA-C

## 2022-05-13 ENCOUNTER — Encounter: Payer: Self-pay | Admitting: Physician Assistant

## 2022-05-17 ENCOUNTER — Encounter: Payer: Self-pay | Admitting: Physician Assistant

## 2022-05-17 ENCOUNTER — Telehealth: Payer: Self-pay | Admitting: Physician Assistant

## 2022-05-17 NOTE — Telephone Encounter (Signed)
Patient called to reschedule missed 2/2 appointments. Patient rescheduled and notified.

## 2022-05-19 ENCOUNTER — Telehealth: Payer: Self-pay | Admitting: Physician Assistant

## 2022-05-19 NOTE — Telephone Encounter (Signed)
Contacted patient to scheduled appointments. Left message with appointment details and a call back number if patient had any questions or could not accommodate the time we provided.   

## 2022-05-20 ENCOUNTER — Other Ambulatory Visit: Payer: Self-pay | Admitting: Physician Assistant

## 2022-05-20 DIAGNOSIS — D500 Iron deficiency anemia secondary to blood loss (chronic): Secondary | ICD-10-CM

## 2022-05-21 ENCOUNTER — Inpatient Hospital Stay (HOSPITAL_BASED_OUTPATIENT_CLINIC_OR_DEPARTMENT_OTHER): Payer: Self-pay | Admitting: Physician Assistant

## 2022-05-21 ENCOUNTER — Inpatient Hospital Stay: Payer: Medicaid Other | Attending: Physician Assistant

## 2022-05-21 ENCOUNTER — Inpatient Hospital Stay: Payer: Medicaid Other

## 2022-05-21 ENCOUNTER — Encounter: Payer: Self-pay | Admitting: Physician Assistant

## 2022-05-21 ENCOUNTER — Inpatient Hospital Stay: Payer: Self-pay | Admitting: Physician Assistant

## 2022-05-21 VITALS — BP 112/44 | HR 66 | Temp 97.7°F | Resp 20 | Wt 171.8 lb

## 2022-05-21 DIAGNOSIS — D500 Iron deficiency anemia secondary to blood loss (chronic): Secondary | ICD-10-CM

## 2022-05-21 DIAGNOSIS — N920 Excessive and frequent menstruation with regular cycle: Secondary | ICD-10-CM | POA: Insufficient documentation

## 2022-05-21 LAB — CBC WITH DIFFERENTIAL (CANCER CENTER ONLY)
Abs Immature Granulocytes: 0 10*3/uL (ref 0.00–0.07)
Basophils Absolute: 0 10*3/uL (ref 0.0–0.1)
Basophils Relative: 0 %
Eosinophils Absolute: 0.1 10*3/uL (ref 0.0–0.5)
Eosinophils Relative: 3 %
HCT: 35.2 % — ABNORMAL LOW (ref 36.0–46.0)
Hemoglobin: 12.5 g/dL (ref 12.0–15.0)
Immature Granulocytes: 0 %
Lymphocytes Relative: 26 %
Lymphs Abs: 1.3 10*3/uL (ref 0.7–4.0)
MCH: 32.1 pg (ref 26.0–34.0)
MCHC: 35.5 g/dL (ref 30.0–36.0)
MCV: 90.5 fL (ref 80.0–100.0)
Monocytes Absolute: 0.2 10*3/uL (ref 0.1–1.0)
Monocytes Relative: 4 %
Neutro Abs: 3.3 10*3/uL (ref 1.7–7.7)
Neutrophils Relative %: 67 %
Platelet Count: 286 10*3/uL (ref 150–400)
RBC: 3.89 MIL/uL (ref 3.87–5.11)
RDW: 15.1 % (ref 11.5–15.5)
WBC Count: 4.9 10*3/uL (ref 4.0–10.5)
nRBC: 0 % (ref 0.0–0.2)

## 2022-05-21 LAB — IRON AND IRON BINDING CAPACITY (CC-WL,HP ONLY)
Iron: 114 ug/dL (ref 28–170)
Saturation Ratios: 34 % — ABNORMAL HIGH (ref 10.4–31.8)
TIBC: 336 ug/dL (ref 250–450)
UIBC: 222 ug/dL (ref 148–442)

## 2022-05-21 LAB — FERRITIN: Ferritin: 13 ng/mL (ref 11–307)

## 2022-05-21 NOTE — Progress Notes (Signed)
[facility] [phone]   [phone]  INITIAL CONSULT NOTE  Patient Care Team: Default, Provider, MD as PCP - General  Hematological/Oncological History -12/31/2021: Labs from PCP, Dr. Fleet Contrasachel Hagler-WBC 5.0, Hgb 8.1 (L), MCV 70.0 (L), Plt 345, Ferritin 3.1 (L), TIBC 555 (H), iron 23 (L), saturation 4% (L), transferriin 297 (H). Hemoglobinopathy profile confirmed sickle cell trait (heterozygous).   -01/26/2022: [facility]  -01/28/2022-02/25/2022: IV venofer 200 mg once a week x 5 doses  CHIEF COMPLAINTS/PURPOSE OF CONSULTATION:  "Iron deficiency anemia "  HISTORY OF PRESENTING ILLNESS:  Paul Dykesshanti R Hardebeck 42 y.o. female returns for a follow up for iron deficiency anemia. She was last seen on 01/26/2022 to establish care. In the interim, she received IV iron infusion that she completed on 02/25/2022. She is unaccompanied for this visit.   On exam today, Ms. Marlyne BeardsJennings reports her energy levels have markedly improved with IV iron infusions. She is no longer craving ice. She continues to have heavy menstrual bleeding and plans to follow up with gynecologist on 06/04/2022. She denies nausea, vomiting or abdominal pain. Her bowel habits are unchanged without recurrent episodes of diarrhea or constipation. She denies fevers, chills, sweats, shortness of breath, chest pain or cough. She has no other complaints. Rest of the 10 point ROS is below.   MEDICAL HISTORY:  No past medical history on file.  SURGICAL HISTORY: No past surgical history on file.  SOCIAL HISTORY: Social History   Socioeconomic History   Marital status: Legally Separated    Spouse name: Not on file   Number of children: Not on file   Years of education: Not on file   Highest education level: Not on file  Occupational History   Not on file  Tobacco Use   Smoking status: Never   Smokeless tobacco: Never  Substance and Sexual Activity   Alcohol use: Not on  file   Drug use: Never   Sexual activity: Not on file  Other Topics Concern   Not on file  Social History Narrative   Not on file   Social Determinants of Health   Financial Resource Strain: Not on file  Food Insecurity: Not on file  Transportation Needs: Not on file  Physical Activity: Not on file  Stress: Not on file  Social Connections: Not on file  Intimate Partner Violence: Not on file    FAMILY HISTORY: Family History  Problem Relation Age of Onset   Breast cancer Mother    Breast cancer Maternal Aunt     ALLERGIES:  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  MEDICATIONS:  Current Outpatient Medications  Medication Sig Dispense Refill   Iron-Folic Acid-Vit B12 (IRON FORMULA PO) Take by mouth daily.     Magnesium 300 MG CAPS Take by mouth 2 (two) times daily. 150 mg  bid  with Malate, glycinate and citrate     COVID-19 mRNA vaccine 2023-2024 (COMIRNATY) syringe Inject into the muscle. (Patient not taking: Reported on 05/21/2022) 0.3 mL 0   metroNIDAZOLE (FLAGYL) 500 MG tablet Take 1 tablet (500 mg total) by mouth 2 (two) times daily. 14 tablet 0   No current facility-administered medications for this visit.    REVIEW OF SYSTEMS:   Constitutional: ( - ) fevers, ( - )  chills , ( - ) night sweats Eyes: ( - ) blurriness of vision, ( - ) double vision, ( - ) watery eyes Ears, nose, mouth, throat, and face: ( - )  mucositis, ( - ) sore throat Respiratory: ( - ) cough, ( - ) dyspnea, ( - ) wheezes Cardiovascular: ( - ) palpitation, ( - ) chest discomfort, ( - ) lower extremity swelling Gastrointestinal:  ( - ) nausea, ( - ) heartburn, ( - ) change in bowel habits Skin: ( - ) abnormal skin rashes Lymphatics: ( - ) new lymphadenopathy, ( - ) easy bruising Neurological: ( - ) numbness, ( - ) tingling, ( - ) new weaknesses Behavioral/Psych: ( - ) mood change, ( - ) new changes  All other systems were reviewed with the patient and are negative.  PHYSICAL  EXAMINATION: ECOG PERFORMANCE STATUS: 1 - Symptomatic but completely ambulatory  Vitals:   05/21/22 1248  BP: (!) 112/44  Pulse: 66  Resp: 20  Temp: 97.7 F (36.5 C)  SpO2: 100%   Filed Weights   05/21/22 1248  Weight: 171 lb 12.8 oz (77.9 kg)    GENERAL: well appearing female in NAD  SKIN: skin color, texture, turgor are normal, no rashes or significant lesions EYES: conjunctiva are pink and non-injected, sclera clear LUNGS: clear to auscultation and percussion with normal breathing effort HEART: regular rate & rhythm and no murmurs and no lower extremity edema Musculoskeletal: no cyanosis of digits and no clubbing  PSYCH: alert & oriented x 3, fluent speech NEURO: no focal motor/sensory deficits  LABORATORY DATA:  I have reviewed the data as listed    Latest Ref Rng & Units 05/21/2022   12:42 PM 01/26/2022   10:15 AM  CBC  WBC 4.0 - 10.5 K/uL 4.9  5.7   Hemoglobin 12.0 - 15.0 g/dL 92.412.5  7.8   Hematocrit 36.0 - 46.0 % 35.2  25.2   Platelets 150 - 400 K/uL 286  362        Latest Ref Rng & Units 01/26/2022   10:15 AM  CMP  Glucose 70 - 99 mg/dL 78   BUN 6 - 20 mg/dL 10   Creatinine 2.680.44 - 1.00 mg/dL 3.410.87   Sodium 962135 - 229145 mmol/L 137   Potassium 3.5 - 5.1 mmol/L 4.2   Chloride 98 - 111 mmol/L 107   CO2 22 - 32 mmol/L 26   Calcium 8.9 - 10.3 mg/dL 9.4   Total Protein 6.5 - 8.1 g/dL 6.8   Total Bilirubin 0.3 - 1.2 mg/dL 0.3   Alkaline Phos 38 - 126 U/L 54   AST 15 - 41 U/L 21   ALT 0 - 44 U/L 10      ASSESSMENT & PLAN Paul Dykesshanti R Ferg is a 42 y.o. female who presents to the hematology clinic for evaluation of iron deficiency anemia.   # Iron Deficiency Anemia 2/2 to GYN Bleeding --Findings are consistent with iron deficiency anemia secondary to patient's menorrhagia --Scheduled to follow up with OB/GYN on 06/04/2022 to discuss interventions for better control of her menstrual cycles --Unable to tolerate PO iron.  --Received IV venofer 200 mg once a week x  5 doses from 01/28/22-02/25/22.  --Labs today show anemia has resolved with Hgb 12.5, MCV 90.5. Iron panel shows serum iron 114, TIBC 336, saturation 34%. Ferritin levels pending. --No need for additional IV iron at this time --RTC in 3 months for labs only and 6 months for labs/follow up.   #Sickle Cell Trait: --Will contribute to patient's microcytosis.  --Monitor for now.   No orders of the defined types were placed in this encounter.   All questions were answered. The patient  knows to call the clinic with any problems, questions or concerns.  I have spent a total of 25 minutes minutes of face-to-face and non-face-to-face time, preparing to see the patient, performing a medically appropriate examination, counseling and educating the patient, ordering medications/tests/procedures, documenting clinical information in the electronic health record,and care coordination.   Georga Kaufmann, PA-C Department of Hematology/Oncology [facility] Phone: [phone]

## 2022-05-24 ENCOUNTER — Telehealth: Payer: Self-pay | Admitting: Physician Assistant

## 2022-05-24 ENCOUNTER — Telehealth: Payer: Self-pay

## 2022-05-24 ENCOUNTER — Encounter: Payer: Self-pay | Admitting: Physician Assistant

## 2022-05-24 NOTE — Addendum Note (Signed)
Addended by: Georga Kaufmann T on: 05/24/2022 12:00 PM   Modules accepted: Orders

## 2022-05-24 NOTE — Telephone Encounter (Signed)
reached out to patient per 4/5 LOS,left voicemail.

## 2022-05-24 NOTE — Telephone Encounter (Signed)
Please notify patient that her ferritin is still low so recommend IV venofer x 2 more doses to improve it    Pt advised via My Chart message

## 2022-05-24 NOTE — Telephone Encounter (Signed)
Reached out to patient to schedule per Karena Addison; sent to [facility].

## 2022-05-27 ENCOUNTER — Ambulatory Visit: Payer: Medicaid Other

## 2022-06-03 ENCOUNTER — Ambulatory Visit: Payer: Medicaid Other

## 2022-06-04 ENCOUNTER — Ambulatory Visit (INDEPENDENT_AMBULATORY_CARE_PROVIDER_SITE_OTHER): Payer: Self-pay | Admitting: Radiology

## 2022-06-04 ENCOUNTER — Other Ambulatory Visit (HOSPITAL_BASED_OUTPATIENT_CLINIC_OR_DEPARTMENT_OTHER): Payer: Self-pay

## 2022-06-04 ENCOUNTER — Encounter: Payer: Self-pay | Admitting: Radiology

## 2022-06-04 VITALS — BP 122/76 | Ht 64.0 in | Wt 168.0 lb

## 2022-06-04 DIAGNOSIS — N939 Abnormal uterine and vaginal bleeding, unspecified: Secondary | ICD-10-CM

## 2022-06-04 DIAGNOSIS — Z30011 Encounter for initial prescription of contraceptive pills: Secondary | ICD-10-CM

## 2022-06-04 MED ORDER — NORETHIN-ETH ESTRAD-FE BIPHAS 1 MG-10 MCG / 10 MCG PO TABS
1.0000 | ORAL_TABLET | Freq: Every day | ORAL | 0 refills | Status: DC
Start: 2022-06-04 — End: 2022-08-24
  Filled 2022-06-04: qty 28, 28d supply, fill #0

## 2022-06-04 NOTE — Progress Notes (Signed)
   Amanda Ferguson 30-Jun-1980 [id number]   History:  42 y.o. G0 presents as a referral from hematology for chronic blood loss. She reports heavy irregular cycles with intermenstrual bleeding that can also be heavy at times. Symptoms for the past 18 months. Has been receiving IV iron infusions.   Gynecologic History Patient's last menstrual period was 06/01/2022 (approximate). Period Pattern: (!) Irregular Menstrual Control: Maxi pad (menstrual cup, period panties) Dysmenorrhea: None Contraception/Family planning: abstinence Sexually active: no   Obstetric History OB History  Gravida Para Term Preterm AB Living  0 0 0 0 0 0  SAB IAB Ectopic Multiple Live Births  0 0 0 0 0     The following portions of the patient's history were reviewed and updated as appropriate: allergies, current medications, past family history, past medical history, past social history, past surgical history, and problem list.  Review of Systems Pertinent items noted in HPI and remainder of comprehensive ROS otherwise negative.   Past medical history, past surgical history, family history and social history were all reviewed and documented in the EPIC chart.   Exam:  Vitals:   06/04/22 1325  BP: 122/76  Weight: 168 lb (76.2 kg)  Height:  (1.626 m)   Body mass index is 28.84 kg/m.  General appearance:  Normal Abdominal  Soft,nontender, without masses, guarding or rebound.  Liver/spleen:  No organomegaly noted  Hernia:  None appreciated  Skin  Inspection:  Grossly normal Genitourinary   Inguinal/mons:  Normal without inguinal adenopathy  External genitalia:  Normal appearing vulva with no masses, tenderness, or lesions  BUS/Urethra/Skene's glands:  Normal without masses or exudate  Vagina:  Normal appearing with normal color and discharge, no lesions  Cervix:  Normal appearing without discharge or lesions  Uterus:  enlarged to 12 week size, mobile, non tender  Adnexa/parametria:      Rt: Normal in size, without masses or tenderness.   Lt: Normal in size, without masses or tenderness.  Anus and perineum: Normal   Raynelle Fanning, CMA present for exam  Assessment/Plan:   1. Abnormal uterine bleeding (AUB) Suspect fibroids - US Transvaginal Non-OB; Future  2. OCP (oral contraceptive pills) initiation Will start OCPs to reduce menstrual blood loss - Norethindrone-Ethinyl Estradiol-Fe Biphas (LO LOESTRIN FE) 1 MG-10 MCG / 10 MCG tablet; Take 1 tablet by mouth daily.  Dispense: 84 tablet; Refill: 0     Djimon Lundstrom B WHNP-BC 1:42 PM 06/04/2022

## 2022-06-07 ENCOUNTER — Telehealth: Payer: Self-pay | Admitting: Physician Assistant

## 2022-06-07 NOTE — Telephone Encounter (Signed)
Patient called to schedule missed infusion appointments. Patient aware of date and times.

## 2022-06-09 ENCOUNTER — Inpatient Hospital Stay: Payer: Medicaid Other

## 2022-06-09 VITALS — BP 116/63 | HR 57 | Temp 98.0°F | Resp 18 | Wt 168.1 lb

## 2022-06-09 DIAGNOSIS — D500 Iron deficiency anemia secondary to blood loss (chronic): Secondary | ICD-10-CM

## 2022-06-09 MED ORDER — SODIUM CHLORIDE 0.9 % IV SOLN
200.0000 mg | Freq: Once | INTRAVENOUS | Status: AC
  Administered 2022-06-09: 200 mg via INTRAVENOUS
  Filled 2022-06-09: qty 200

## 2022-06-09 MED ORDER — SODIUM CHLORIDE 0.9 % IV SOLN: Freq: Once | INTRAVENOUS | Status: AC

## 2022-06-09 NOTE — Progress Notes (Signed)
Patient tolerated iron infusion well. She declined to stay 30 minute post observation. VSS at discharge.

## 2022-06-09 NOTE — Patient Instructions (Signed)

## 2022-06-11 ENCOUNTER — Encounter: Payer: Self-pay | Admitting: Radiology

## 2022-06-16 ENCOUNTER — Encounter: Payer: Self-pay | Admitting: Physician Assistant

## 2022-06-16 ENCOUNTER — Inpatient Hospital Stay: Payer: Self-pay | Attending: Physician Assistant

## 2022-06-16 VITALS — BP 107/54 | HR 58 | Resp 16

## 2022-06-16 DIAGNOSIS — N920 Excessive and frequent menstruation with regular cycle: Secondary | ICD-10-CM | POA: Insufficient documentation

## 2022-06-16 DIAGNOSIS — D500 Iron deficiency anemia secondary to blood loss (chronic): Secondary | ICD-10-CM | POA: Insufficient documentation

## 2022-06-16 MED ORDER — SODIUM CHLORIDE 0.9 % IV SOLN: Freq: Once | INTRAVENOUS | Status: AC

## 2022-06-16 MED ORDER — SODIUM CHLORIDE 0.9 % IV SOLN
200.0000 mg | Freq: Once | INTRAVENOUS | Status: AC
  Administered 2022-06-16: 200 mg via INTRAVENOUS
  Filled 2022-06-16: qty 200

## 2022-06-16 NOTE — Patient Instructions (Signed)

## 2022-07-29 ENCOUNTER — Other Ambulatory Visit: Payer: Medicaid Other

## 2022-07-29 ENCOUNTER — Other Ambulatory Visit: Payer: Medicaid Other | Admitting: Radiology

## 2022-08-16 ENCOUNTER — Encounter: Payer: Self-pay | Admitting: Physician Assistant

## 2022-08-23 ENCOUNTER — Inpatient Hospital Stay: Payer: Self-pay | Attending: Physician Assistant

## 2022-08-23 ENCOUNTER — Inpatient Hospital Stay: Payer: Self-pay

## 2022-08-23 ENCOUNTER — Other Ambulatory Visit: Payer: Self-pay

## 2022-08-23 DIAGNOSIS — D500 Iron deficiency anemia secondary to blood loss (chronic): Secondary | ICD-10-CM

## 2022-08-23 DIAGNOSIS — N920 Excessive and frequent menstruation with regular cycle: Secondary | ICD-10-CM | POA: Insufficient documentation

## 2022-08-23 LAB — CBC WITH DIFFERENTIAL (CANCER CENTER ONLY)
Abs Immature Granulocytes: 0 10*3/uL (ref 0.00–0.07)
Basophils Absolute: 0 10*3/uL (ref 0.0–0.1)
Basophils Relative: 0 %
Eosinophils Absolute: 0.1 10*3/uL (ref 0.0–0.5)
Eosinophils Relative: 3 %
HCT: 37.1 % (ref 36.0–46.0)
Hemoglobin: 13.3 g/dL (ref 12.0–15.0)
Immature Granulocytes: 0 %
Lymphocytes Relative: 32 %
Lymphs Abs: 1.6 10*3/uL (ref 0.7–4.0)
MCH: 33.3 pg (ref 26.0–34.0)
MCHC: 35.8 g/dL (ref 30.0–36.0)
MCV: 93 fL (ref 80.0–100.0)
Monocytes Absolute: 0.1 10*3/uL (ref 0.1–1.0)
Monocytes Relative: 3 %
Neutro Abs: 3.2 10*3/uL (ref 1.7–7.7)
Neutrophils Relative %: 62 %
Platelet Count: 314 10*3/uL (ref 150–400)
RBC: 3.99 MIL/uL (ref 3.87–5.11)
RDW: 12.8 % (ref 11.5–15.5)
WBC Count: 5.1 10*3/uL (ref 4.0–10.5)
nRBC: 0 % (ref 0.0–0.2)

## 2022-08-23 LAB — IRON AND IRON BINDING CAPACITY (CC-WL,HP ONLY)
Iron: 173 ug/dL — ABNORMAL HIGH (ref 28–170)
Saturation Ratios: 59 % — ABNORMAL HIGH (ref 10.4–31.8)
TIBC: 294 ug/dL (ref 250–450)
UIBC: 121 ug/dL — ABNORMAL LOW (ref 148–442)

## 2022-08-23 LAB — FERRITIN: Ferritin: 30 ng/mL (ref 11–307)

## 2022-08-24 ENCOUNTER — Ambulatory Visit (INDEPENDENT_AMBULATORY_CARE_PROVIDER_SITE_OTHER): Payer: Self-pay

## 2022-08-24 ENCOUNTER — Ambulatory Visit (INDEPENDENT_AMBULATORY_CARE_PROVIDER_SITE_OTHER): Payer: Self-pay | Admitting: Radiology

## 2022-08-24 VITALS — BP 124/82

## 2022-08-24 DIAGNOSIS — N840 Polyp of corpus uteri: Secondary | ICD-10-CM

## 2022-08-24 DIAGNOSIS — R9389 Abnormal findings on diagnostic imaging of other specified body structures: Secondary | ICD-10-CM

## 2022-08-24 DIAGNOSIS — D251 Intramural leiomyoma of uterus: Secondary | ICD-10-CM

## 2022-08-24 DIAGNOSIS — D252 Subserosal leiomyoma of uterus: Secondary | ICD-10-CM

## 2022-08-24 DIAGNOSIS — N939 Abnormal uterine and vaginal bleeding, unspecified: Secondary | ICD-10-CM

## 2022-08-24 NOTE — Progress Notes (Signed)
   Amanda Ferguson 1980/09/06 [id number]   History:  42 y.o. G0 presents for follow up u/s to evaluate abnormal bleeding and an enlarged uterus.  She  was a referral from hematology for chronic blood loss. She reports heavy irregular cycles with intermenstrual bleeding that can also be heavy at times. Symptoms for the past 21 months. Has been receiving IV iron infusions.   Gynecologic History Patient's last menstrual period was 08/15/2022 (exact date).   Contraception/Family planning: abstinence Sexually active: no   Obstetric History OB History  Gravida Para Term Preterm AB Living  0 0 0 0 0 0  SAB IAB Ectopic Multiple Live Births  0 0 0 0 0     The following portions of the patient's history were reviewed and updated as appropriate: allergies, current medications, past family history, past medical history, past social history, past surgical history, and problem list.  Review of Systems Pertinent items noted in HPI and remainder of comprehensive ROS otherwise negative.   Past medical history, past surgical history, family history and social history were all reviewed and documented in the EPIC chart.   Exam:  Vitals:   08/24/22 1444  BP: 124/82   There is no height or weight on file to calculate BMI.  Narrative & Impression Indication: abnormal bleeding , anemia, enlarged uterus   Vaginal u/s:   Anteverted enlarged uterus 10.87 x 8.99 x 6.29cm Multiple fibroids (11 measured) with the largest 5.61cm x 4.56cm   Thickened irregular endometrium 14mm Echogenic mass with feeder vessel identified ?polyp   Both ovaries normal size with normal perfusion No adnexal masses or free fluid   Impression: fibroid uterus with thickened endometrium and polyp present         Exam Ended: 08/24/22 14:47 Last Resulted: 08/24/22 15:03        Assessment/Plan:   1. Intramural and subserous leiomyoma of uterus  2. Thickened endometrium  3. Endometrial polyp Will refer to  Dr Estanislado Pandy for management - Ambulatory referral to Obstetrics / Gynecology    [name] WHNP-BC 3:08 PM 08/24/2022

## 2022-11-23 ENCOUNTER — Telehealth: Payer: Self-pay | Admitting: Physician Assistant

## 2022-11-24 ENCOUNTER — Inpatient Hospital Stay: Payer: Self-pay

## 2022-11-24 ENCOUNTER — Inpatient Hospital Stay: Payer: Self-pay | Admitting: Physician Assistant

## 2022-11-25 ENCOUNTER — Other Ambulatory Visit: Payer: Self-pay | Admitting: Physician Assistant

## 2022-11-25 DIAGNOSIS — D500 Iron deficiency anemia secondary to blood loss (chronic): Secondary | ICD-10-CM

## 2022-11-26 ENCOUNTER — Inpatient Hospital Stay: Payer: Self-pay | Attending: Physician Assistant

## 2022-11-26 ENCOUNTER — Telehealth: Payer: Self-pay | Admitting: Medical Oncology

## 2022-11-26 ENCOUNTER — Encounter: Payer: Self-pay | Admitting: Physician Assistant

## 2022-11-26 ENCOUNTER — Inpatient Hospital Stay (HOSPITAL_BASED_OUTPATIENT_CLINIC_OR_DEPARTMENT_OTHER): Payer: Self-pay | Admitting: Physician Assistant

## 2022-11-26 VITALS — BP 107/78 | HR 60 | Temp 98.4°F | Resp 17 | Ht 64.0 in | Wt 173.0 lb

## 2022-11-26 DIAGNOSIS — D573 Sickle-cell trait: Secondary | ICD-10-CM | POA: Insufficient documentation

## 2022-11-26 DIAGNOSIS — D509 Iron deficiency anemia, unspecified: Secondary | ICD-10-CM | POA: Insufficient documentation

## 2022-11-26 DIAGNOSIS — D500 Iron deficiency anemia secondary to blood loss (chronic): Secondary | ICD-10-CM

## 2022-11-26 DIAGNOSIS — N920 Excessive and frequent menstruation with regular cycle: Secondary | ICD-10-CM | POA: Insufficient documentation

## 2022-11-26 LAB — CBC WITH DIFFERENTIAL (CANCER CENTER ONLY)
Abs Immature Granulocytes: 0.01 10*3/uL (ref 0.00–0.07)
Basophils Absolute: 0 10*3/uL (ref 0.0–0.1)
Basophils Relative: 1 %
Eosinophils Absolute: 0.2 10*3/uL (ref 0.0–0.5)
Eosinophils Relative: 3 %
HCT: 37.1 % (ref 36.0–46.0)
Hemoglobin: 12.9 g/dL (ref 12.0–15.0)
Immature Granulocytes: 0 %
Lymphocytes Relative: 22 %
Lymphs Abs: 1.5 10*3/uL (ref 0.7–4.0)
MCH: 32.4 pg (ref 26.0–34.0)
MCHC: 34.8 g/dL (ref 30.0–36.0)
MCV: 93.2 fL (ref 80.0–100.0)
Monocytes Absolute: 0.3 10*3/uL (ref 0.1–1.0)
Monocytes Relative: 4 %
Neutro Abs: 4.7 10*3/uL (ref 1.7–7.7)
Neutrophils Relative %: 70 %
Platelet Count: 298 10*3/uL (ref 150–400)
RBC: 3.98 MIL/uL (ref 3.87–5.11)
RDW: 12.5 % (ref 11.5–15.5)
WBC Count: 6.6 10*3/uL (ref 4.0–10.5)
nRBC: 0 % (ref 0.0–0.2)

## 2022-11-26 LAB — IRON AND IRON BINDING CAPACITY (CC-WL,HP ONLY)
Iron: 79 ug/dL (ref 28–170)
Saturation Ratios: 24 % (ref 10.4–31.8)
TIBC: 336 ug/dL (ref 250–450)
UIBC: 257 ug/dL (ref 148–442)

## 2022-11-26 LAB — FERRITIN: Ferritin: 13 ng/mL (ref 11–307)

## 2022-11-26 NOTE — Telephone Encounter (Signed)
Pt notified of Irene's instructions.

## 2022-11-26 NOTE — Progress Notes (Signed)
[facility] Telephone:([phone]) [phone]   Fax:([phone]) [phone]  PROGRESS NOTE  Patient Care Team: Aliene Beams, MD as PCP - General (Family Medicine) [name], NP as Nurse Practitioner (Radiology)  Hematological/Oncological History -12/31/2021: Labs from PCP, Dr. Fleet Contras Hagler-WBC 5.0, Hgb 8.1 (L), MCV 70.0 (L), Plt 345, Ferritin 3.1 (L), TIBC 555 (H), iron 23 (L), saturation 4% (L), transferriin 297 (H). Hemoglobinopathy profile confirmed sickle cell trait (heterozygous).   -01/26/2022: Establish care with [facility] Hematology  -01/28/2022-02/25/2022: IV venofer 200 mg once a week x 5 doses  -06/09/2022-06/16/2022: IV venofer 200 mg once a week x 2 doses  CHIEF COMPLAINTS/PURPOSE OF CONSULTATION:  "Iron deficiency anemia "  HISTORY OF PRESENTING ILLNESS:  Amanda Ferguson 42 y.o. female returns for a follow up for iron deficiency anemia.She is unaccompanied for this visit.   On exam today, Amanda Ferguson reports her energy levels initially improved after receiving her last round of IV iron back in April 2024. She underwent a procedure to address the uterine fibroids and endometrial polyp along with IUD placement two weeks ago. After the procedure, her energy has slightly declined. She is able to complete her ADLs on her own. She adds that she had more bleeding after the procedure and now it is just spotting. She denies nausea, vomiting or abdominal pain. Her bowel habits are unchanged without recurrent episodes of diarrhea or constipation. She denies ice craving.  She denies fevers, chills, sweats, shortness of breath, chest pain or cough. She has no other complaints. Rest of the 10 point ROS is below.   MEDICAL HISTORY:  Past Medical History:  Diagnosis Date   Iron deficiency anemia     SURGICAL HISTORY: No past surgical history on file.  SOCIAL HISTORY: Social History   Socioeconomic History   Marital status: Legally Separated    Spouse name: Not on file    Number of children: Not on file   Years of education: Not on file   Highest education level: Not on file  Occupational History   Not on file  Tobacco Use   Smoking status: Never    Passive exposure: Past   Smokeless tobacco: Never  Substance and Sexual Activity   Alcohol use: Yes    Comment: rare   Drug use: Never   Sexual activity: Not Currently    Partners: Male    Birth control/protection: Abstinence    Comment: menarche 42yo, sexual debut 42yo  Other Topics Concern   Not on file  Social History Narrative   Not on file   Social Determinants of Health   Financial Resource Strain: Not on file  Food Insecurity: Not on file  Transportation Needs: Not on file  Physical Activity: Not on file  Stress: Not on file  Social Connections: Not on file  Intimate Partner Violence: Not on file    FAMILY HISTORY: Family History  Problem Relation Age of Onset   Breast cancer Mother    Breast cancer Maternal Aunt     ALLERGIES:  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  MEDICATIONS:  Current Outpatient Medications  Medication Sig Dispense Refill   Iron-Folic Acid-Vit B12 (IRON FORMULA PO) Take by mouth daily.     Magnesium 300 MG CAPS Take by mouth 2 (two) times daily. 150 mg  bid  with Malate, glycinate and citrate     No current facility-administered medications for this visit.    REVIEW OF SYSTEMS:   Constitutional: ( - ) fevers, ( - )  chills , ( - ) night sweats Eyes: ( - ) blurriness of vision, ( - ) double vision, ( - ) watery eyes Ears, nose, mouth, throat, and face: ( - ) mucositis, ( - ) sore throat Respiratory: ( - ) cough, ( - ) dyspnea, ( - ) wheezes Cardiovascular: ( - ) palpitation, ( - ) chest discomfort, ( - ) lower extremity swelling Gastrointestinal:  ( - ) nausea, ( - ) heartburn, ( - ) change in bowel habits Skin: ( - ) abnormal skin rashes Lymphatics: ( - ) new lymphadenopathy, ( - ) easy bruising Neurological: ( - ) numbness, ( - ) tingling, ( - )  new weaknesses Behavioral/Psych: ( - ) mood change, ( - ) new changes  All other systems were reviewed with the patient and are negative.  PHYSICAL EXAMINATION: ECOG PERFORMANCE STATUS: 1 - Symptomatic but completely ambulatory  Vitals:   11/26/22 0940  BP: 107/78  Pulse: 60  Resp: 17  Temp: 98.4 F (36.9 C)  SpO2: 100%   Filed Weights   11/26/22 0940  Weight: 173 lb (78.5 kg)    GENERAL: well appearing female in NAD  SKIN: skin color, texture, turgor are normal, no rashes or significant lesions EYES: conjunctiva are pink and non-injected, sclera clear LUNGS: clear to auscultation and percussion with normal breathing effort HEART: regular rate & rhythm and no murmurs and no lower extremity edema Musculoskeletal: no cyanosis of digits and no clubbing  PSYCH: alert & oriented x 3, fluent speech NEURO: no focal motor/sensory deficits  LABORATORY DATA:  I have reviewed the data as listed    Latest Ref Rng & Units 11/26/2022    9:21 AM 08/23/2022   12:15 PM 05/21/2022   12:42 PM  CBC  WBC 4.0 - 10.5 K/uL 6.6  5.1  4.9   Hemoglobin 12.0 - 15.0 g/dL 44.0  10.2  72.5   Hematocrit 36.0 - 46.0 % 37.1  37.1  35.2   Platelets 150 - 400 K/uL 298  314  286        Latest Ref Rng & Units 01/26/2022   10:15 AM  CMP  Glucose 70 - 99 mg/dL 78   BUN 6 - 20 mg/dL 10   Creatinine 3.66 - 1.00 mg/dL 4.40   Sodium 347 - 425 mmol/L 137   Potassium 3.5 - 5.1 mmol/L 4.2   Chloride 98 - 111 mmol/L 107   CO2 22 - 32 mmol/L 26   Calcium 8.9 - 10.3 mg/dL 9.4   Total Protein 6.5 - 8.1 g/dL 6.8   Total Bilirubin 0.3 - 1.2 mg/dL 0.3   Alkaline Phos 38 - 126 U/L 54   AST 15 - 41 U/L 21   ALT 0 - 44 U/L 10      ASSESSMENT & PLAN Amanda Ferguson is a 42 y.o. female who returns for a follow up for iron deficiency anemia.   # Iron Deficiency Anemia 2/2 to GYN Bleeding --Findings are consistent with iron deficiency anemia secondary to patient's menorrhagia and fibroids.  --Under the care  of OB/GYN, Arlie Solomons NP. Recently underwent procedure 2 weeks ago with IUD placement.  --Last received IV venofer 200 mg once a week x 2 doses from 06/09/2022-06/16/2022. --Labs today shows no evidence of anemia with Hgb 12.9, MCV 93.2. Iron panel shows serum iron 79, TIBC 336, saturation 24%, ferritin 13.  --Currently takes PO iron twice a week only. Advised to increase iron pills to once a day.  --  RTC in 3 months for labs only and 6 months for labs/follow up. If iron levels don't improve in 3 months, we will arrange for IV iron.   #Sickle Cell Trait: --Will contribute to patient's microcytosis.  --Monitor for now.   No orders of the defined types were placed in this encounter.   All questions were answered. The patient knows to call the clinic with any problems, questions or concerns.  I have spent a total of 25 minutes minutes of face-to-face and non-face-to-face time, preparing to see the patient, performing a medically appropriate examination, counseling and educating the patient, ordering medications/tests/procedures, documenting clinical information in the electronic health record,and care coordination.   Georga Kaufmann, PA-C Department of Hematology/Oncology [facility] at [facility] Phone: [phone]

## 2022-11-26 NOTE — Telephone Encounter (Signed)
-----   Message from Briant Cedar sent at 11/26/2022  2:19 PM EDT ----- Please notify patient that iron levels are below normal. Recommend to take iron pills daily instead of twice weekly. Will recheck labs in 3 months and if iron levels don't improve, we will arrange for IV iron.

## 2023-02-18 ENCOUNTER — Encounter: Payer: Self-pay | Admitting: Physician Assistant

## 2023-02-21 ENCOUNTER — Inpatient Hospital Stay: Payer: Self-pay | Attending: Physician Assistant

## 2023-02-21 DIAGNOSIS — D500 Iron deficiency anemia secondary to blood loss (chronic): Secondary | ICD-10-CM | POA: Insufficient documentation

## 2023-02-21 DIAGNOSIS — N920 Excessive and frequent menstruation with regular cycle: Secondary | ICD-10-CM | POA: Insufficient documentation

## 2023-02-21 LAB — CBC WITH DIFFERENTIAL (CANCER CENTER ONLY)
Abs Immature Granulocytes: 0.02 10*3/uL (ref 0.00–0.07)
Basophils Absolute: 0 10*3/uL (ref 0.0–0.1)
Basophils Relative: 0 %
Eosinophils Absolute: 0.1 10*3/uL (ref 0.0–0.5)
Eosinophils Relative: 1 %
HCT: 38.2 % (ref 36.0–46.0)
Hemoglobin: 13.8 g/dL (ref 12.0–15.0)
Immature Granulocytes: 0 %
Lymphocytes Relative: 24 %
Lymphs Abs: 1.7 10*3/uL (ref 0.7–4.0)
MCH: 32.5 pg (ref 26.0–34.0)
MCHC: 36.1 g/dL — ABNORMAL HIGH (ref 30.0–36.0)
MCV: 90.1 fL (ref 80.0–100.0)
Monocytes Absolute: 0.3 10*3/uL (ref 0.1–1.0)
Monocytes Relative: 4 %
Neutro Abs: 5.1 10*3/uL (ref 1.7–7.7)
Neutrophils Relative %: 71 %
Platelet Count: 308 10*3/uL (ref 150–400)
RBC: 4.24 MIL/uL (ref 3.87–5.11)
RDW: 13 % (ref 11.5–15.5)
WBC Count: 7.2 10*3/uL (ref 4.0–10.5)
nRBC: 0 % (ref 0.0–0.2)

## 2023-02-21 LAB — IRON AND IRON BINDING CAPACITY (CC-WL,HP ONLY)
Iron: 151 ug/dL (ref 28–170)
Saturation Ratios: 44 % — ABNORMAL HIGH (ref 10.4–31.8)
TIBC: 343 ug/dL (ref 250–450)
UIBC: 192 ug/dL (ref 148–442)

## 2023-02-21 LAB — FERRITIN: Ferritin: 20 ng/mL (ref 11–307)

## 2023-03-07 ENCOUNTER — Encounter: Payer: Self-pay | Admitting: Physician Assistant

## 2023-03-09 ENCOUNTER — Ambulatory Visit (INDEPENDENT_AMBULATORY_CARE_PROVIDER_SITE_OTHER): Payer: Self-pay | Admitting: Radiology

## 2023-03-09 ENCOUNTER — Other Ambulatory Visit (HOSPITAL_BASED_OUTPATIENT_CLINIC_OR_DEPARTMENT_OTHER): Payer: Self-pay

## 2023-03-09 ENCOUNTER — Encounter: Payer: Self-pay | Admitting: Physician Assistant

## 2023-03-09 VITALS — BP 110/68 | HR 105

## 2023-03-09 DIAGNOSIS — N898 Other specified noninflammatory disorders of vagina: Secondary | ICD-10-CM

## 2023-03-09 LAB — WET PREP FOR TRICH, YEAST, CLUE

## 2023-03-09 MED ORDER — METRONIDAZOLE 500 MG PO TABS
500.0000 mg | ORAL_TABLET | Freq: Two times a day (BID) | ORAL | 0 refills | Status: DC
  Filled 2023-03-09: qty 14, 7d supply, fill #0

## 2023-03-09 NOTE — Progress Notes (Signed)
      Subjective: Amanda Ferguson is a 43 y.o. female who complains of vaginal discharge, odor, no itching. Symptoms increased over the last 2-3 weeks.    Review of Systems  All other systems reviewed and are negative.   Past Medical History:  Diagnosis Date   Iron deficiency anemia       Objective:  There were no vitals filed for this visit. There is no height or weight on file to calculate BMI.   Physical Exam Vitals and nursing note reviewed. Exam conducted with a chaperone present.  Constitutional:      Appearance: Normal appearance. She is well-developed.  Pulmonary:     Effort: Pulmonary effort is normal.  Abdominal:     General: Abdomen is flat.     Palpations: Abdomen is soft.  Genitourinary:    General: Normal vulva.     Vagina: Vaginal discharge present. No erythema, bleeding or lesions.     Cervix: Normal. No discharge, friability, lesion or erythema.     Uterus: Normal.      Adnexa: Right adnexa normal and left adnexa normal.  Neurological:     Mental Status: She is alert.  Psychiatric:        Mood and Affect: Mood normal.        Thought Content: Thought content normal.        Judgment: Judgment normal.    Microscopic wet-mount exam shows clue cells.   Amanda Ferguson, CMA present for exam  Assessment:/Plan:  1. Vaginal discharge (Primary) +BV rx sent for flagyl - WET PREP FOR TRICH, YEAST, CLUE    Avoid the use of soaps or perfumed products in the peri area. Avoid tub baths and sitting in sweaty or wet clothing for prolonged periods of time.     Amanda Ferguson B, NP 2:42 PM

## 2023-04-12 ENCOUNTER — Other Ambulatory Visit: Payer: Self-pay | Admitting: Obstetrics and Gynecology

## 2023-04-12 DIAGNOSIS — Z1231 Encounter for screening mammogram for malignant neoplasm of breast: Secondary | ICD-10-CM

## 2023-05-10 ENCOUNTER — Telehealth: Payer: Self-pay | Admitting: Physician Assistant

## 2023-05-10 NOTE — Telephone Encounter (Signed)
 Rescheduled appointments per provider request. Left the patient a voicemail with the appointment details and the patient will be mailed an appointment reminder.

## 2023-05-24 ENCOUNTER — Other Ambulatory Visit: Payer: Self-pay | Admitting: Physician Assistant

## 2023-05-24 DIAGNOSIS — D500 Iron deficiency anemia secondary to blood loss (chronic): Secondary | ICD-10-CM

## 2023-05-25 ENCOUNTER — Other Ambulatory Visit: Payer: Medicaid Other

## 2023-05-25 ENCOUNTER — Inpatient Hospital Stay: Payer: Self-pay | Admitting: Physician Assistant

## 2023-05-25 ENCOUNTER — Inpatient Hospital Stay: Payer: Self-pay | Attending: Physician Assistant

## 2023-05-25 ENCOUNTER — Telehealth: Payer: Self-pay | Admitting: Physician Assistant

## 2023-05-25 ENCOUNTER — Ambulatory Visit: Payer: Medicaid Other | Admitting: Physician Assistant

## 2023-05-25 NOTE — Telephone Encounter (Signed)
 Re-Scheduled patient's appts per a staff message received. Advised patient to contact us if rescheduling is needed.  Provided my direct line.

## 2023-06-08 ENCOUNTER — Inpatient Hospital Stay: Payer: Self-pay | Attending: Physician Assistant

## 2023-06-08 ENCOUNTER — Inpatient Hospital Stay: Payer: Self-pay | Admitting: Physician Assistant

## 2023-08-26 ENCOUNTER — Encounter (HOSPITAL_BASED_OUTPATIENT_CLINIC_OR_DEPARTMENT_OTHER): Payer: Self-pay | Admitting: Radiology

## 2023-08-26 DIAGNOSIS — Z1231 Encounter for screening mammogram for malignant neoplasm of breast: Secondary | ICD-10-CM

## 2023-09-01 ENCOUNTER — Telehealth: Payer: Self-pay

## 2023-09-01 DIAGNOSIS — L255 Unspecified contact dermatitis due to plants, except food: Secondary | ICD-10-CM

## 2023-09-02 ENCOUNTER — Other Ambulatory Visit (HOSPITAL_BASED_OUTPATIENT_CLINIC_OR_DEPARTMENT_OTHER): Payer: Self-pay

## 2023-09-02 ENCOUNTER — Encounter: Payer: Self-pay | Admitting: Physician Assistant

## 2023-09-02 MED ORDER — PREDNISONE 10 MG (21) PO TBPK
ORAL_TABLET | ORAL | 0 refills | Status: DC
  Filled 2023-09-02: qty 21, 6d supply, fill #0

## 2023-09-02 NOTE — Progress Notes (Signed)

## 2023-11-09 ENCOUNTER — Other Ambulatory Visit (HOSPITAL_COMMUNITY): Payer: Self-pay

## 2023-11-15 ENCOUNTER — Encounter (HOSPITAL_BASED_OUTPATIENT_CLINIC_OR_DEPARTMENT_OTHER): Payer: Self-pay | Admitting: Family Medicine

## 2023-11-15 ENCOUNTER — Ambulatory Visit (INDEPENDENT_AMBULATORY_CARE_PROVIDER_SITE_OTHER): Payer: Self-pay | Admitting: Family Medicine

## 2023-11-15 ENCOUNTER — Encounter (HOSPITAL_BASED_OUTPATIENT_CLINIC_OR_DEPARTMENT_OTHER): Payer: Self-pay | Admitting: *Deleted

## 2023-11-15 VITALS — BP 125/55 | HR 58 | Temp 98.3°F | Ht 64.0 in | Wt 174.0 lb

## 2023-11-15 DIAGNOSIS — Z1231 Encounter for screening mammogram for malignant neoplasm of breast: Secondary | ICD-10-CM

## 2023-11-15 DIAGNOSIS — Z7689 Persons encountering health services in other specified circumstances: Secondary | ICD-10-CM

## 2023-11-15 DIAGNOSIS — Z13220 Encounter for screening for lipoid disorders: Secondary | ICD-10-CM

## 2023-11-15 DIAGNOSIS — Z0000 Encounter for general adult medical examination without abnormal findings: Secondary | ICD-10-CM

## 2023-11-15 NOTE — Patient Instructions (Addendum)
 Please return for fasting blood work.  For fasting, if your blood work is in the morning please do not eat any food after midnight.  You may have water or black coffee prior to your lab work.  Please take all regularly prescribed medications even if you are fasting.  If your blood work is in the afternoon, please fast for at least 5 to 6 hours.  You may continue to drink water and/or black coffee prior to your lab work.  Please take all scheduled medications even if you are fasting.   Things to do to keep yourself healthy: - Exercise at least 30-45 minutes a day, 3-4 days a week.  - Eat a low-fat diet with lots of fruits and vegetables, up to 7-9 servings per day.  - Seatbelts can save your life. Wear them always.  - Smoke detectors on every level of your home, check batteries every year.  - Eye Doctor - have an eye exam every 1-2 years  - Safe sex - if you may be exposed to STDs, use a condom.  - No smoking, vaping, or use of any tobacco products.  - Alcohol  -  If you drink, do it moderately, less than 2 drinks per day.  - No illegal drug use.  - Depression is common in our stressful world.If you're feeling down or losing interest in things you normally enjoy, please come in for a visit.  - Violence - If anyone is threatening or hurting you, please call immediately.

## 2023-11-15 NOTE — Progress Notes (Signed)
 New Patient Office Visit  Subjective:   Amanda Ferguson Jan 23, 1981 11/15/2023  Chief Complaint  Patient presents with   New Patient (Initial Visit)    Patient is here today to get established with the practice. Denies any main concerns for today's visit but states that she is needing to have a physical for school.    Discussed the use of AI scribe software for clinical note transcription with the patient, who gave verbal consent to proceed.  History of Present Illness Patient is here to establish care with new PCP. She is needing an annual exam.   She has a history of uterine fibroids and underwent surgery earlier this year to remove a polyp due to internal bleeding and low hemoglobin levels. An IUD was placed during the procedure, which has significantly improved her bleeding, stating 'hardly ever do it.'She has a history of iron  deficiency anemia and was receiving regular iron  infusions. Following her surgery and IUD placement, she stopped taking iron  supplements about a month post-surgery as her condition improved. Her current medications include a Mirena IUD, magnesium once a day, and vitamin D.  She has a history of sickle cell trait without active symptoms and exercise-induced asthma, which she has not experienced in over ten years.   Labs collected at time of visit. She is needing forms completed for PTA program at [facility] as well as Varicella titer testing.   HEALTH SCREENINGS: - Vision Screening: Recommended - Dental Visits: up to date - Pap smear: Will obtain results with OBGYN- Dr. Rutherford  - Breast Exam: Declined - STD Screening: Declined - Mammogram (40+): Ordered today  - Colonoscopy (45+): Not applicable  - Bone Density (65+ or under 65 with predisposing conditions): Not applicable  - Lung CA screening with low-dose CT:  Not applicable Adults age 59-80 who are current cigarette smokers or quit within the last 15 years. Must have 20 pack year history.    Depression and Anxiety Screen done today and results listed below:     11/15/2023    2:06 PM  Depression screen PHQ 2/9  Decreased Interest 0  Down, Depressed, Hopeless 0  PHQ - 2 Score 0  Altered sleeping 0  Tired, decreased energy 2  Change in appetite 0  Feeling bad or failure about yourself  0  Trouble concentrating 0  Moving slowly or fidgety/restless 0  Suicidal thoughts 0  PHQ-9 Score 2  Difficult doing work/chores Not difficult at all      11/15/2023    2:06 PM  GAD 7 : Generalized Anxiety Score  Nervous, Anxious, on Edge 0  Control/stop worrying 0  Worry too much - different things 0  Trouble relaxing 0  Restless 0  Easily annoyed or irritable 0  Afraid - awful might happen 0  Total GAD 7 Score 0  Anxiety Difficulty Not difficult at all    IMMUNIZATIONS: - Tdap: Tetanus vaccination status reviewed: last tetanus booster within 10 years. - HPV: UTD per patient; will obtain records - Influenza: Will obtain at work - Pneumovax: Not applicable - Prevnar 20: Not applicable - Shingrix (50+): Not applicable   Past medical history, surgical history, medications, allergies, family history and social history reviewed with patient today and changes made to appropriate areas of the chart.   Past Medical History:  Diagnosis Date   Allergy    Asthma    exercise induced   Iron  deficiency anemia     Past Surgical History:  Procedure Laterality Date  Uterine Polyp Removal      Current Outpatient Medications on File Prior to Visit  Medication Sig   levonorgestrel (MIRENA) 20 MCG/DAY IUD 1 each by Intrauterine route once. Inserted 10/2022 per patient   Magnesium 300 MG CAPS Take by mouth 2 (two) times daily. 150 mg  bid  with Malate, glycinate and citrate   VITAMIN D PO Take by mouth.   No current facility-administered medications on file prior to visit.    Allergies  Allergen Reactions   Penicillins Nausea And Vomiting and Other (See Comments)   Soy  Allergy (Obsolete) Diarrhea, Nausea Only and Swelling     Social History   Socioeconomic History   Marital status: Legally Separated    Spouse name: Not on file   Number of children: Not on file   Years of education: Not on file   Highest education level: Bachelor's degree (e.g., BA, AB, BS)  Occupational History   Not on file  Tobacco Use   Smoking status: Never    Passive exposure: Past   Smokeless tobacco: Never  Substance and Sexual Activity   Alcohol use: Not Currently    Comment: I have one drink every six months or so.   Drug use: Not Currently    Types: Marijuana   Sexual activity: Not Currently    Partners: Male    Birth control/protection: Abstinence, I.U.D.    Comment: menarche 43yo, sexual debut 43yo  Other Topics Concern   Not on file  Social History Narrative   Not on file   Social Drivers of Health   Financial Resource Strain: Medium Risk (11/09/2023)   Overall Financial Resource Strain (CARDIA)    Difficulty of Paying Living Expenses: Somewhat hard  Food Insecurity: No Food Insecurity (11/09/2023)   Hunger Vital Sign    Worried About Running Out of Food in the Last Year: Never true    Ran Out of Food in the Last Year: Never true  Transportation Needs: No Transportation Needs (11/09/2023)   PRAPARE - Administrator, Civil Service (Medical): No    Lack of Transportation (Non-Medical): No  Physical Activity: Sufficiently Active (11/09/2023)   Exercise Vital Sign    Days of Exercise per Week: 4 days    Minutes of Exercise per Session: 50 min  Stress: No Stress Concern Present (11/09/2023)   [employer] of Occupational Health - Occupational Stress Questionnaire    Feeling of Stress: Not at all  Social Connections: Socially Isolated (11/09/2023)   Social Connection and Isolation Panel    Frequency of Communication with Friends and Family: Never    Frequency of Social Gatherings with Friends and Family: Once a week    Attends Religious  Services: Never    Database administrator or Organizations: No    Attends Engineer, structural: Not on file    Marital Status: Separated  Intimate Partner Violence: Not on file   Social History   Tobacco Use  Smoking Status Never   Passive exposure: Past  Smokeless Tobacco Never   Social History   Substance and Sexual Activity  Alcohol Use Not Currently   Comment: I have one drink every six months or so.    Family History  Problem Relation Age of Onset   Breast cancer Mother 51   Hypertension Mother    Obesity Mother    Early death Father 46   Obesity Father    Kidney disease Sister        in  childhood (3-4 yo)   Obesity Sister    Obesity Maternal Grandmother    Breast cancer Maternal Aunt    Obesity Maternal Aunt    Alcohol abuse Maternal Aunt    Obesity Maternal Aunt    Obesity Maternal Aunt    Alcohol abuse Maternal Uncle    Obesity Maternal Uncle      ROS: Denies fever, fatigue, unexplained weight loss/gain, chest pain, SHOB, and palpitations. Denies neurological deficits, gastrointestinal or genitourinary complaints, and skin changes.   Objective:   Today's Vitals   11/15/23 1404  BP: (!) 125/55  Pulse: (!) 58  Temp: 98.3 F (36.8 C)  TempSrc: Oral  SpO2: 100%  Weight: 174 lb (78.9 kg)  Height: 5' 4 (1.626 m)    GENERAL APPEARANCE: Well-appearing, in NAD. Well nourished.  SKIN: Pink, warm and dry. Turgor normal. No rash, lesion, ulceration, or ecchymoses. Hair evenly distributed.  HEENT: HEAD: Normocephalic.  EYES: PERRLA. EOMI. Lids intact w/o defect. Sclera white, Conjunctiva pink w/o exudate.  EARS: External ear w/o redness, swelling, masses or lesions. EAC clear. TM's intact, translucent w/o bulging, appropriate landmarks visualized. Appropriate acuity to conversational tones.  NOSE: Septum midline w/o deformity. Nares patent, mucosa pink and non-inflamed w/o drainage. No sinus tenderness.  THROAT: Uvula midline. Oropharynx clear.  Tonsils non-inflamed w/o exudate. Oral mucosa pink and moist.  NECK: Supple, Trachea midline. Full ROM w/o pain or tenderness. No lymphadenopathy. Thyroid non-tender w/o enlargement or palpable masses.  RESPIRATORY: Chest wall symmetrical w/o masses. Respirations even and non-labored. Breath sounds clear to auscultation bilaterally. No wheezes, rales, rhonchi, or crackles. CARDIAC: S1, S2 present, regular rate and rhythm. No gallops, murmurs, rubs, or clicks. PMI w/o lifts, heaves, or thrills. No carotid bruits. Capillary refill <2 seconds. Peripheral pulses 2+ bilaterally. GI: Abdomen soft w/o distention. Normoactive bowel sounds. No palpable masses or tenderness. No guarding or rebound tenderness. Liver and spleen w/o tenderness or enlargement. No CVA tenderness.  MSK: Muscle tone and strength appropriate for age, w/o atrophy or abnormal movement.  EXTREMITIES: Active ROM intact, w/o tenderness, crepitus, or contracture. No obvious joint deformities or effusions. No clubbing, edema, or cyanosis.  NEUROLOGIC: CN's II-XII intact. Motor strength symmetrical with no obvious weakness. No sensory deficits. DTR's 2+ symmetric bilaterally. Steady, even gait.  PSYCH/MENTAL STATUS: Alert, oriented x 3. Cooperative, appropriate mood and affect.     Assessment & Plan:  1. Encounter to establish care with new doctor (Primary) Discussed role of PCP with patient and reviewed medical history, surgical history and family history.   2. Annual physical exam Discussed preventative screenings, vaccines, and healthy lifestyle with patient. Will obtain fasting labs within the week. Completed forms for school clearance.  - CBC with Differential/Platelet; Future - Comprehensive metabolic panel with GFR; Future - Lipid panel; Future - TSH; Future - Varicella zoster antibody, IgG; Future  3. Breast cancer screening by mammogram - MM 3D SCREENING MAMMOGRAM BILATERAL BREAST; Future  4. Screening for lipid  disorders - Lipid panel; Future   Orders Placed This Encounter  Procedures   MM 3D SCREENING MAMMOGRAM BILATERAL BREAST    Standing Status:   Future    Expiration Date:   11/14/2024    Reason for Exam (SYMPTOM  OR DIAGNOSIS REQUIRED):   Screening    Is the patient pregnant?:   No    Preferred imaging location?:   [facility]   CBC with Differential/Platelet    Standing Status:   Future    Expiration Date:   05/14/2024  Comprehensive metabolic panel with GFR    Standing Status:   Future    Expiration Date:   05/14/2024   Lipid panel    Standing Status:   Future    Expiration Date:   05/14/2024   TSH    Standing Status:   Future    Expiration Date:   05/14/2024   Varicella zoster antibody, IgG    Standing Status:   Future    Expiration Date:   11/14/2024    PATIENT COUNSELING:  - Encouraged a healthy well-balanced diet. Patient may adjust caloric intake to maintain or achieve ideal body weight. May reduce intake of dietary saturated fat and total fat and have adequate dietary potassium and calcium preferably from fresh fruits, vegetables, and low-fat dairy products.   - Advised to avoid cigarette smoking. - Discussed with the patient that most people either abstain from alcohol or drink within safe limits (<=14/week and <=4 drinks/occasion for males, <=7/weeks and <= 3 drinks/occasion for females) and that the risk for alcohol disorders and other health effects rises proportionally with the number of drinks per week and how often a drinker exceeds daily limits. - Discussed cessation/primary prevention of drug use and availability of treatment for abuse.  - Discussed sexually transmitted diseases, avoidance of unintended pregnancy and contraceptive alternatives.  - Stressed the importance of regular exercise - Injury prevention: Discussed safety belts, safety helmets, smoke detector, smoking near bedding or upholstery.  - Dental health: Discussed importance of regular tooth  brushing, flossing, and dental visits.   NEXT PREVENTATIVE PHYSICAL DUE IN 1 YEAR.  Return in about 1 year (around 11/14/2024) for ANNUAL PHYSICAL.  Patient to reach out to office if new, worrisome, or unresolved symptoms arise or if no improvement in patient's condition. Patient verbalized understanding and is agreeable to treatment plan. All questions answered to patient's satisfaction.    Thersia Schuyler Stark, [place]

## 2023-11-16 ENCOUNTER — Other Ambulatory Visit (HOSPITAL_BASED_OUTPATIENT_CLINIC_OR_DEPARTMENT_OTHER): Payer: Self-pay | Admitting: *Deleted

## 2023-11-16 DIAGNOSIS — Z13220 Encounter for screening for lipoid disorders: Secondary | ICD-10-CM

## 2023-11-16 DIAGNOSIS — Z0000 Encounter for general adult medical examination without abnormal findings: Secondary | ICD-10-CM

## 2023-11-17 LAB — COMPREHENSIVE METABOLIC PANEL WITH GFR
ALT: 13 IU/L (ref 0–32)
AST: 20 IU/L (ref 0–40)
Albumin: 4.4 g/dL (ref 3.9–4.9)
Alkaline Phosphatase: 66 IU/L (ref 41–116)
BUN/Creatinine Ratio: 9 (ref 9–23)
BUN: 10 mg/dL (ref 6–24)
Bilirubin Total: 0.4 mg/dL (ref 0.0–1.2)
CO2: 21 mmol/L (ref 20–29)
Calcium: 9.5 mg/dL (ref 8.7–10.2)
Chloride: 102 mmol/L (ref 96–106)
Creatinine, Ser: 1.06 mg/dL — ABNORMAL HIGH (ref 0.57–1.00)
Globulin, Total: 2.2 g/dL (ref 1.5–4.5)
Glucose: 82 mg/dL (ref 70–99)
Potassium: 4.2 mmol/L (ref 3.5–5.2)
Sodium: 137 mmol/L (ref 134–144)
Total Protein: 6.6 g/dL (ref 6.0–8.5)
eGFR: 67 mL/min/1.73 (ref >59)

## 2023-11-17 LAB — CBC WITH DIFFERENTIAL/PLATELET
Basophils Absolute: 0 x10E3/uL (ref 0.0–0.2)
Basos: 0 %
EOS (ABSOLUTE): 0.1 x10E3/uL (ref 0.0–0.4)
Eos: 3 %
Hematocrit: 41.5 % (ref 34.0–46.6)
Hemoglobin: 13.7 g/dL (ref 11.1–15.9)
Immature Grans (Abs): 0 x10E3/uL (ref 0.0–0.1)
Immature Granulocytes: 0 %
Lymphocytes Absolute: 1.5 x10E3/uL (ref 0.7–3.1)
Lymphs: 33 %
MCH: 32.7 pg (ref 26.6–33.0)
MCHC: 33 g/dL (ref 31.5–35.7)
MCV: 99 fL — ABNORMAL HIGH (ref 79–97)
Monocytes Absolute: 0.2 x10E3/uL (ref 0.1–0.9)
Monocytes: 4 %
Neutrophils Absolute: 2.7 x10E3/uL (ref 1.4–7.0)
Neutrophils: 60 %
Platelets: 234 x10E3/uL (ref 150–450)
RBC: 4.19 x10E6/uL (ref 3.77–5.28)
RDW: 13 % (ref 11.7–15.4)
WBC: 4.6 x10E3/uL (ref 3.4–10.8)

## 2023-11-17 LAB — LIPID PANEL
Chol/HDL Ratio: 3.3 ratio (ref 0.0–4.4)
Cholesterol, Total: 171 mg/dL (ref 100–199)
HDL: 52 mg/dL (ref >39)
LDL Chol Calc (NIH): 107 mg/dL — ABNORMAL HIGH (ref 0–99)
Triglycerides: 61 mg/dL (ref 0–149)
VLDL Cholesterol Cal: 12 mg/dL (ref 5–40)

## 2023-11-17 LAB — VARICELLA ZOSTER ANTIBODY, IGG: Varicella zoster IgG: REACTIVE

## 2023-11-17 LAB — TSH: TSH: 2.46 u[IU]/mL (ref 0.450–4.500)

## 2023-11-18 ENCOUNTER — Other Ambulatory Visit (HOSPITAL_BASED_OUTPATIENT_CLINIC_OR_DEPARTMENT_OTHER): Payer: Self-pay

## 2023-11-18 ENCOUNTER — Encounter: Payer: Self-pay | Admitting: Physician Assistant

## 2023-11-18 MED ORDER — FLUZONE 0.5 ML IM SUSY
0.5000 mL | PREFILLED_SYRINGE | Freq: Once | INTRAMUSCULAR | 0 refills | Status: AC
  Filled 2023-11-18: qty 0.5, 1d supply, fill #0

## 2023-11-20 ENCOUNTER — Ambulatory Visit (HOSPITAL_BASED_OUTPATIENT_CLINIC_OR_DEPARTMENT_OTHER): Payer: Self-pay | Admitting: Family Medicine

## 2023-11-20 NOTE — Progress Notes (Signed)
 Hi Tressia,  Your varicella immunity is present. Thyroid function and blood counts are stable. Your kidney function was slightly decreased compared to last year. I would recommend increasing your water intake and recheck these labs in 2-3 weeks. Your LDL cholesterol was slightly elevated, but total cholesterol is well controlled. Continue a balanced diet and regular exercise. If you would like labs rechecked in 2-3 weeks, please let me know.

## 2024-02-02 ENCOUNTER — Ambulatory Visit (HOSPITAL_BASED_OUTPATIENT_CLINIC_OR_DEPARTMENT_OTHER): Payer: Self-pay | Admitting: Family Medicine
